# Patient Record
Sex: Female | Born: 1945 | Race: White | Hispanic: No | Marital: Married | Smoking: Never smoker
Health system: Southern US, Community
[De-identification: ages and names within clinical notes are randomized; demographics above are authoritative.]

## PROBLEM LIST (undated history)

## (undated) DIAGNOSIS — F0390 Unspecified dementia without behavioral disturbance: Secondary | ICD-10-CM

## (undated) DIAGNOSIS — M1990 Unspecified osteoarthritis, unspecified site: Secondary | ICD-10-CM

## (undated) DIAGNOSIS — G20 Parkinson's disease: Secondary | ICD-10-CM

## (undated) DIAGNOSIS — G20A1 Parkinson's disease without dyskinesia, without mention of fluctuations: Secondary | ICD-10-CM

## (undated) HISTORY — PX: APPENDECTOMY: SHX54

---

## 2006-03-04 ENCOUNTER — Ambulatory Visit: Payer: Self-pay | Admitting: Orthopedic Surgery

## 2009-10-13 ENCOUNTER — Ambulatory Visit: Payer: Self-pay | Admitting: Endocrinology

## 2012-05-12 ENCOUNTER — Ambulatory Visit: Payer: Self-pay | Admitting: Ophthalmology

## 2012-05-26 ENCOUNTER — Ambulatory Visit: Payer: Self-pay | Admitting: Ophthalmology

## 2012-06-10 DIAGNOSIS — G20 Parkinson's disease: Secondary | ICD-10-CM | POA: Insufficient documentation

## 2013-08-17 ENCOUNTER — Encounter: Payer: Self-pay | Admitting: Endocrinology

## 2013-08-18 ENCOUNTER — Encounter: Payer: Self-pay | Admitting: Endocrinology

## 2014-01-05 ENCOUNTER — Ambulatory Visit: Payer: Self-pay | Admitting: Orthopaedic Surgery

## 2014-06-10 NOTE — Op Note (Signed)
PATIENT NAME:  Jillian Vaughn, Jillian Vaughn MR#:  [id number] DATE OF BIRTH:  [birth year]  DATE OF PROCEDURE:  05/26/2012  PREOPERATIVE DIAGNOSIS: Visually significant cataract of the right eye.   POSTOPERATIVE DIAGNOSIS: Visually significant cataract of the right eye.   OPERATIVE PROCEDURE: Cataract extraction by phacoemulsification with implant of intraocular lens to the right eye.   SURGEON: Birder Robson, MD.   ANESTHESIA: Topical.  COMPLICATIONS: None.   TECHNIQUE:  Stop-and-chop.   DESCRIPTION OF PROCEDURE: The patient was examined and consented in the preoperative holding area where the aforementioned topical anesthesia was applied to the right eye, and then brought back to the operating room where the right eye was prepped and draped in the usual sterile ophthalmic fashion and a lid speculum was placed. A paracentesis was created with the side port blade and the anterior chamber was filled with viscoelastic. A near-clear corneal incision was performed with the steel keratome. A continuous curvilinear capsulorrhexis was performed with a cystotome followed by the capsulorrhexis forceps. Hydrodissection and hydrodelineation were carried out with BSS on a blunt cannula. The lens was removed in a stop-and-chop technique and the remaining cortical material was removed with the irrigation-aspiration handpiece. The capsular bag was inflated with viscoelastic and the Tecnis ZCB00 21.5-diopter lens, serial #[id number] was placed in the capsular bag without complication. The remaining viscoelastic was removed from the right eye with the irrigation-aspiration handpiece. The wounds were hydrated. The anterior chamber was flushed with Miostat and the eye was inflated to physiologic pressure; 0.1 mL of cefuroxime concentration, 10 mg/mL was placed in the anterior chamber. The wounds were found to be water tight. The right eye was dressed with Vigamox. The patient was given protective glasses to wear throughout the day  and a shield with which to sleep tonight. The patient was also given drops with which to begin a drop regimen today and will follow up with me in one day.       ____________________________ [name]. [name], MD wlp:dm D: 05/26/2012 12:37:57 ET T: 05/26/2012 13:21:38 ET JOB#: [id number]  cc: [name] L. [name], MD, <Dictator> [name] [name] MD ELECTRONICALLY SIGNED 06/02/2012 12:51

## 2014-10-02 ENCOUNTER — Encounter: Payer: Self-pay | Admitting: Emergency Medicine

## 2014-10-02 ENCOUNTER — Emergency Department
Admission: EM | Admit: 2014-10-02 | Discharge: 2014-10-02 | Disposition: A | Discharge status: Home / Self Care | Payer: Medicare HMO | Attending: Emergency Medicine | Admitting: Emergency Medicine

## 2014-10-02 DIAGNOSIS — K5901 Slow transit constipation: Secondary | ICD-10-CM | POA: Insufficient documentation

## 2014-10-02 DIAGNOSIS — K5900 Constipation, unspecified: Secondary | ICD-10-CM | POA: Diagnosis present

## 2014-10-02 DIAGNOSIS — G20 Parkinson's disease: Secondary | ICD-10-CM | POA: Diagnosis not present

## 2014-10-02 DIAGNOSIS — K5641 Fecal impaction: Secondary | ICD-10-CM

## 2014-10-02 HISTORY — DX: Unspecified osteoarthritis, unspecified site: M19.90

## 2014-10-02 HISTORY — DX: Parkinson's disease: G20

## 2014-10-02 HISTORY — DX: Parkinson's disease without dyskinesia, without mention of fluctuations: G20.A1

## 2014-10-02 MED ORDER — PEG 3350-KCL-NA BICARB-NACL 420 G PO SOLR: 4000.0000 mL | Freq: Once | ORAL | Status: DC

## 2014-10-02 NOTE — ED Notes (Addendum)
Pt reports she as only able to use the bathroom once a week states it has only been going on for months. Pt states she thinks her Parkinson's medication is the cause. States she only went a little this morning. Denies abd pain. Denies vomiting or fever. States in the past a oral laxative has helped. Pt states he stools are black .

## 2014-10-02 NOTE — Discharge Instructions (Signed)
Constipation °Constipation is when a person has fewer than three bowel movements a week, has difficulty having a bowel movement, or has stools that are dry, hard, or larger than normal. As people grow older, constipation is more common. If you try to fix constipation with medicines that make you have a bowel movement (laxatives), the problem may get worse. Long-term laxative use may cause the muscles of the colon to become weak. A low-fiber diet, not taking in enough fluids, and taking certain medicines may make constipation worse.  °CAUSES  °· Certain medicines, such as antidepressants, pain medicine, iron supplements, antacids, and water pills.   °· Certain diseases, such as diabetes, irritable bowel syndrome (IBS), thyroid disease, or depression.   °· Not drinking enough water.   °· Not eating enough fiber-rich foods.   °· Stress or travel.   °· Lack of physical activity or exercise.   °· Ignoring the urge to have a bowel movement.   °· Using laxatives too much.   °SIGNS AND SYMPTOMS  °· Having fewer than three bowel movements a week.   °· Straining to have a bowel movement.   °· Having stools that are hard, dry, or larger than normal.   °· Feeling full or bloated.   °· Pain in the lower abdomen.   °· Not feeling relief after having a bowel movement.   °DIAGNOSIS  °Your health care provider will take a medical history and perform a physical exam. Further testing may be done for severe constipation. Some tests may include: °· A barium enema X-ray to examine your rectum, colon, and, sometimes, your small intestine.   °· A sigmoidoscopy to examine your lower colon.   °· A colonoscopy to examine your entire colon. °TREATMENT  °Treatment will depend on the severity of your constipation and what is causing it. Some dietary treatments include drinking more fluids and eating more fiber-rich foods. Lifestyle treatments may include regular exercise. If these diet and lifestyle recommendations do not help, your health care  provider may recommend taking over-the-counter laxative medicines to help you have bowel movements. Prescription medicines may be prescribed if over-the-counter medicines do not work.  °HOME CARE INSTRUCTIONS  °· Eat foods that have a lot of fiber, such as fruits, vegetables, whole grains, and beans. °· Limit foods high in fat and processed sugars, such as french fries, hamburgers, cookies, candies, and soda.   °· A fiber supplement may be added to your diet if you cannot get enough fiber from foods.   °· Drink enough fluids to keep your urine clear or pale yellow.   °· Exercise regularly or as directed by your health care provider.   °· Go to the restroom when you have the urge to go. Do not hold it.   °· Only take over-the-counter or prescription medicines as directed by your health care provider. Do not take other medicines for constipation without talking to your health care provider first.   °SEEK IMMEDIATE MEDICAL CARE IF:  °· You have bright red blood in your stool.   °· Your constipation lasts for more than 4 days or gets worse.   °· You have abdominal or rectal pain.   °· You have thin, pencil-like stools.   °· You have unexplained weight loss. °MAKE SURE YOU:  °· Understand these instructions. °· Will watch your condition. °· Will get help right away if you are not doing well or get worse. °Document Released: 11/03/2003 Document Revised: 02/09/2013 Document Reviewed: 11/16/2012 °[employer]® Patient Information ©2015 [employer]. This information is not intended to replace advice given to you by your health care provider. Make sure you discuss any questions   you have with your health care provider.  Constipation Constipation is when a person:  Poops (has a bowel movement) less than 3 times a week.  Has a hard time pooping.  Has poop that is dry, hard, or bigger than normal. HOME CARE   Eat foods with a lot of fiber in them. This includes fruits, vegetables, beans, and whole grains such as brown  rice.  Avoid fatty foods and foods with a lot of sugar. This includes french fries, hamburgers, cookies, candy, and soda.  If you are not getting enough fiber from food, take products with added fiber in them (supplements).  Drink enough fluid to keep your pee (urine) clear or pale yellow.  Exercise on a regular basis, or as told by your doctor.  Go to the restroom when you feel like you need to poop. Do not hold it.  Only take medicine as told by your doctor. Do not take medicines that help you poop (laxatives) without talking to your doctor first. GET HELP RIGHT AWAY IF:   You have bright red blood in your poop (stool).  Your constipation lasts more than 4 days or gets worse.  You have belly (abdominal) or butt (rectal) pain.  You have thin poop (as thin as a pencil).  You lose weight, and it cannot be explained. MAKE SURE YOU:   Understand these instructions.  Will watch your condition.  Will get help right away if you are not doing well or get worse. Document Released: 07/24/2007 Document Revised: 02/09/2013 Document Reviewed: 11/16/2012 [facility] Patient Information 2015 [place], [place]. This information is not intended to replace advice given to you by your health care provider. Make sure you discuss any questions you have with your health care provider.  Fecal Impaction A fecal impaction happens when there is a large, firm amount of stool (or feces) that cannot be passed. The impacted stool is usually in the rectum, which is the lowest part of the large bowel. The impacted stool can block the colon and cause significant problems. CAUSES  The longer stool stays in the rectum, the harder it gets. Anything that slows down your bowel movements can lead to fecal impaction, such as:  Constipation. This can be a long-standing (chronic) problem or can happen suddenly (acute).  Painful conditions of the rectum, such as hemorrhoids or anal fissures. The pain of these conditions  can make you try to avoid having bowel movements.  Narcotic pain-relieving medicines, such as methadone, morphine, or codeine.  Not drinking enough fluids.  Inactivity and bed rest over long periods of time.  Diseases of the brain or nervous system that damage the nerves controlling the muscles of the intestines. SIGNS AND SYMPTOMS   Lack of normal bowel movements or changes in bowel patterns.  Sense of fullness in the rectum but unable to pass stool.  Pain or cramps in the abdominal area (often after meals).  Thin, watery discharge from the rectum. DIAGNOSIS  Your health care provider may suspect that you have a fecal impaction based on your symptoms and a physical exam. This will include an exam of your rectum. Sometimes X-rays or lab testing may be needed to confirm the diagnosis and to be sure there are no other problems.  TREATMENT   Initially an impaction can be removed manually. Using a gloved finger, your health care provider can remove hard stool from your rectum.  Medicine is sometimes needed. A suppository or enema can be given in the rectum to soften  the stool, which can stimulate a bowel movement. Medicines can also be given by mouth (orally).  Though rare, surgery may be needed if the colon has torn (perforated) due to blockage. HOME CARE INSTRUCTIONS   Develop regular bowel habits. This could include getting in the habit of having a bowel movement after your morning cup of coffee or after eating. Be sure to allow yourself enough time on the toilet.  Maintain a high-fiber diet.  Drink enough fluids to keep your urine clear or pale yellow as directed by your health care provider.  Exercise regularly.  If you begin to get constipated, increase the amount of fiber in your diet. Eat plenty of fruits, vegetables, whole wheat breads, bran, oatmeal, and similar products.  Take natural fiber laxatives or other laxatives only as directed by your health care provider. SEEK  MEDICAL CARE IF:   You have ongoing rectal pain.  You require enemas or suppositories more than twice a week.  You have rectal bleeding.  You have continued problems, or you develop abdominal pain.  You have thin, pencil-like stools. SEEK IMMEDIATE MEDICAL CARE IF:  You have black or tarry stools. MAKE SURE YOU:   Understand these instructions.  Will watch your condition.  Will get help right away if you are not doing well or get worse. Document Released: 10/28/2003 Document Revised: 11/25/2012 Document Reviewed: 08/11/2012 [facility] Patient Information 2015 [place], [place]. This information is not intended to replace advice given to you by your health care provider. Make sure you discuss any questions you have with your health care provider.

## 2014-10-02 NOTE — ED Provider Notes (Addendum)
[facility] Emergency Department Provider Note     Time seen: ----------------------------------------- 10:43 AM on 10/02/2014 -----------------------------------------    I have reviewed the triage vital signs and the nursing notes.   HISTORY  Chief Complaint Constipation    HPI Jillian Vaughn is a 69 y.o. female who presents to ER for constipation. Reports she is only able to use the bathroom once a week and states his been going on for months. Patient thinks it's her Parkinson's medication. Patient underwent a little less morning, denies any abdominal pain. States in the past oral laxative has helped.   Past Medical History  Diagnosis Date  . Parkinson disease   . Arthritis     There are no active problems to display for this patient.   Past Surgical History  Procedure Laterality Date  . Appendectomy      Allergies Review of patient's allergies indicates not on file.  Social History Social History  Substance Use Topics  . Smoking status: Never Smoker   . Smokeless tobacco: Never Used  . Alcohol Use: Yes     Comment: occ    Review of Systems Constitutional: Negative for fever. Eyes: Negative for visual changes. ENT: Negative for sore throat. Cardiovascular: Negative for chest pain. Respiratory: Negative for shortness of breath. Gastrointestinal: Negative for abdominal pain, positive constipation Genitourinary: Negative for dysuria. Musculoskeletal: Negative for back pain. Skin: Negative for rash. Neurological: Negative for headaches, focal weakness or numbness.  10-point ROS otherwise negative.  ____________________________________________   PHYSICAL EXAM:  VITAL SIGNS: ED Triage Vitals  Enc Vitals Group     BP 10/02/14 0949 159/73 mmHg     Pulse Rate 10/02/14 0949 89     Resp --      Temp 10/02/14 0949 97.8 F (36.6 C)     Temp Source 10/02/14 0949 Oral     SpO2 10/02/14 0949 100 %     Weight 10/02/14 0949 94 lb  (42.638 kg)     Height 10/02/14 0949 5' 2.5" (1.588 m)     Head Cir --      Peak Flow --      Pain Score --      Pain Loc --      Pain Edu? --      Excl. in [place]? --     Constitutional: Alert and oriented. Well appearing and in no distress. Eyes: Conjunctivae are normal. PERRL. Normal extraocular movements. ENT   Head: Normocephalic and atraumatic.   Nose: No congestion/rhinnorhea.   Mouth/Throat: Mucous membranes are moist.   Neck: No stridor. Cardiovascular: Normal rate, regular rhythm. Normal and symmetric distal pulses are present in all extremities. No murmurs, rubs, or gallops. Respiratory: Normal respiratory effort without tachypnea nor retractions. Breath sounds are clear and equal bilaterally. No wheezes/rales/rhonchi. Gastrointestinal: Soft and nontender. No distention. No abdominal bruits.  Rectal: There is stool in the rectal vault, mild to moderate fecal impaction Musculoskeletal: Nontender with normal range of motion in all extremities. No joint effusions.  No lower extremity tenderness nor edema. Neurologic:  Normal speech and language. No gross focal neurologic deficits are appreciated. Speech is normal. No gait instability. Skin:  Skin is warm, dry and intact. No rash noted. Psychiatric: Mood and affect are normal. Speech and behavior are normal. Patient exhibits appropriate insight and judgment.  ____________________________________________  ED COURSE:  Pertinent labs & imaging results that were available during my care of the patient were reviewed by me and considered in my medical decision  making (see chart for details). Patient will receive a soapsuds enema, Will reevaluate  ____________________________________________  FINAL ASSESSMENT AND PLAN  Constipation, fecal impaction  Plan: Patient with labs and imaging as dictated above. Patient constipated, improved with soapsuds enema and disimpaction. We'll discharge with GoLYTELY to continue helping  her completely pass all of her fecal contents at home.   Earleen Newport, MD   Earleen Newport, MD 10/02/14 Petros, MD 10/02/14 1257

## 2014-11-02 ENCOUNTER — Other Ambulatory Visit: Payer: Self-pay | Admitting: Endocrinology

## 2014-11-02 DIAGNOSIS — Z1231 Encounter for screening mammogram for malignant neoplasm of breast: Secondary | ICD-10-CM

## 2014-11-17 ENCOUNTER — Ambulatory Visit
Admission: RE | Admit: 2014-11-17 | Discharge: 2014-11-17 | Disposition: A | Discharge status: Home / Self Care | Payer: Medicare HMO | Source: Ambulatory Visit | Attending: Endocrinology | Admitting: Endocrinology

## 2014-11-17 ENCOUNTER — Other Ambulatory Visit: Payer: Self-pay | Admitting: Endocrinology

## 2014-11-17 DIAGNOSIS — Z1231 Encounter for screening mammogram for malignant neoplasm of breast: Secondary | ICD-10-CM

## 2014-11-18 ENCOUNTER — Other Ambulatory Visit: Payer: Self-pay | Admitting: Endocrinology

## 2014-11-18 DIAGNOSIS — M25551 Pain in right hip: Secondary | ICD-10-CM

## 2014-11-18 DIAGNOSIS — R1031 Right lower quadrant pain: Secondary | ICD-10-CM

## 2014-11-23 ENCOUNTER — Ambulatory Visit
Admission: RE | Admit: 2014-11-23 | Discharge: 2014-11-23 | Disposition: A | Discharge status: Home / Self Care | Payer: Medicare HMO | Source: Ambulatory Visit | Attending: Endocrinology | Admitting: Endocrinology

## 2014-11-23 DIAGNOSIS — D259 Leiomyoma of uterus, unspecified: Secondary | ICD-10-CM | POA: Diagnosis not present

## 2014-11-23 DIAGNOSIS — R1031 Right lower quadrant pain: Secondary | ICD-10-CM

## 2014-11-23 DIAGNOSIS — N83201 Unspecified ovarian cyst, right side: Secondary | ICD-10-CM | POA: Insufficient documentation

## 2014-11-23 DIAGNOSIS — M25551 Pain in right hip: Secondary | ICD-10-CM | POA: Insufficient documentation

## 2014-11-23 MED ORDER — IOHEXOL 300 MG/ML  SOLN
75.0000 mL | Freq: Once | INTRAMUSCULAR | Status: AC | PRN
  Administered 2014-11-23: 75 mL via INTRAVENOUS

## 2014-12-28 ENCOUNTER — Other Ambulatory Visit: Payer: Self-pay | Admitting: Orthopedic Surgery

## 2014-12-28 DIAGNOSIS — M5440 Lumbago with sciatica, unspecified side: Principal | ICD-10-CM

## 2014-12-28 DIAGNOSIS — M545 Low back pain, unspecified: Secondary | ICD-10-CM

## 2015-01-16 ENCOUNTER — Ambulatory Visit: Payer: Medicare HMO

## 2015-02-03 ENCOUNTER — Ambulatory Visit: Payer: Medicare HMO

## 2015-02-28 DIAGNOSIS — M5416 Radiculopathy, lumbar region: Secondary | ICD-10-CM | POA: Diagnosis not present

## 2015-02-28 DIAGNOSIS — M5126 Other intervertebral disc displacement, lumbar region: Secondary | ICD-10-CM | POA: Diagnosis not present

## 2015-03-02 DIAGNOSIS — G20 Parkinson's disease: Secondary | ICD-10-CM | POA: Diagnosis not present

## 2015-03-02 DIAGNOSIS — M25552 Pain in left hip: Secondary | ICD-10-CM | POA: Diagnosis not present

## 2015-03-02 DIAGNOSIS — E042 Nontoxic multinodular goiter: Secondary | ICD-10-CM | POA: Diagnosis not present

## 2015-03-02 DIAGNOSIS — N951 Menopausal and female climacteric states: Secondary | ICD-10-CM | POA: Diagnosis not present

## 2015-03-02 DIAGNOSIS — E785 Hyperlipidemia, unspecified: Secondary | ICD-10-CM | POA: Diagnosis not present

## 2015-03-02 DIAGNOSIS — M810 Age-related osteoporosis without current pathological fracture: Secondary | ICD-10-CM | POA: Diagnosis not present

## 2015-03-02 DIAGNOSIS — F5101 Primary insomnia: Secondary | ICD-10-CM | POA: Diagnosis not present

## 2015-03-02 DIAGNOSIS — N6019 Diffuse cystic mastopathy of unspecified breast: Secondary | ICD-10-CM | POA: Diagnosis not present

## 2015-03-02 DIAGNOSIS — E559 Vitamin D deficiency, unspecified: Secondary | ICD-10-CM | POA: Diagnosis not present

## 2015-03-02 DIAGNOSIS — K5900 Constipation, unspecified: Secondary | ICD-10-CM | POA: Diagnosis not present

## 2015-03-02 DIAGNOSIS — D259 Leiomyoma of uterus, unspecified: Secondary | ICD-10-CM | POA: Diagnosis not present

## 2015-03-09 DIAGNOSIS — G20 Parkinson's disease: Secondary | ICD-10-CM | POA: Diagnosis not present

## 2015-03-09 DIAGNOSIS — E042 Nontoxic multinodular goiter: Secondary | ICD-10-CM | POA: Diagnosis not present

## 2015-03-09 DIAGNOSIS — D259 Leiomyoma of uterus, unspecified: Secondary | ICD-10-CM | POA: Diagnosis not present

## 2015-03-09 DIAGNOSIS — F5101 Primary insomnia: Secondary | ICD-10-CM | POA: Diagnosis not present

## 2015-03-09 DIAGNOSIS — E785 Hyperlipidemia, unspecified: Secondary | ICD-10-CM | POA: Diagnosis not present

## 2015-03-09 DIAGNOSIS — M810 Age-related osteoporosis without current pathological fracture: Secondary | ICD-10-CM | POA: Diagnosis not present

## 2015-03-09 DIAGNOSIS — N951 Menopausal and female climacteric states: Secondary | ICD-10-CM | POA: Diagnosis not present

## 2015-03-09 DIAGNOSIS — E559 Vitamin D deficiency, unspecified: Secondary | ICD-10-CM | POA: Diagnosis not present

## 2015-03-09 DIAGNOSIS — M25552 Pain in left hip: Secondary | ICD-10-CM | POA: Diagnosis not present

## 2015-03-09 DIAGNOSIS — K5900 Constipation, unspecified: Secondary | ICD-10-CM | POA: Diagnosis not present

## 2015-03-09 DIAGNOSIS — N6019 Diffuse cystic mastopathy of unspecified breast: Secondary | ICD-10-CM | POA: Diagnosis not present

## 2015-03-21 DIAGNOSIS — M5416 Radiculopathy, lumbar region: Secondary | ICD-10-CM | POA: Diagnosis not present

## 2015-03-21 DIAGNOSIS — M5126 Other intervertebral disc displacement, lumbar region: Secondary | ICD-10-CM | POA: Diagnosis not present

## 2015-03-23 DIAGNOSIS — M25552 Pain in left hip: Secondary | ICD-10-CM | POA: Diagnosis not present

## 2015-03-23 DIAGNOSIS — E042 Nontoxic multinodular goiter: Secondary | ICD-10-CM | POA: Diagnosis not present

## 2015-03-23 DIAGNOSIS — N6019 Diffuse cystic mastopathy of unspecified breast: Secondary | ICD-10-CM | POA: Diagnosis not present

## 2015-03-23 DIAGNOSIS — M810 Age-related osteoporosis without current pathological fracture: Secondary | ICD-10-CM | POA: Diagnosis not present

## 2015-03-23 DIAGNOSIS — E785 Hyperlipidemia, unspecified: Secondary | ICD-10-CM | POA: Diagnosis not present

## 2015-03-23 DIAGNOSIS — N951 Menopausal and female climacteric states: Secondary | ICD-10-CM | POA: Diagnosis not present

## 2015-03-23 DIAGNOSIS — E559 Vitamin D deficiency, unspecified: Secondary | ICD-10-CM | POA: Diagnosis not present

## 2015-03-23 DIAGNOSIS — F5101 Primary insomnia: Secondary | ICD-10-CM | POA: Diagnosis not present

## 2015-03-23 DIAGNOSIS — D259 Leiomyoma of uterus, unspecified: Secondary | ICD-10-CM | POA: Diagnosis not present

## 2015-03-23 DIAGNOSIS — G20 Parkinson's disease: Secondary | ICD-10-CM | POA: Diagnosis not present

## 2015-03-23 DIAGNOSIS — K5900 Constipation, unspecified: Secondary | ICD-10-CM | POA: Diagnosis not present

## 2015-04-03 DIAGNOSIS — M4806 Spinal stenosis, lumbar region: Secondary | ICD-10-CM | POA: Diagnosis not present

## 2015-04-03 DIAGNOSIS — M5416 Radiculopathy, lumbar region: Secondary | ICD-10-CM | POA: Diagnosis not present

## 2015-04-25 DIAGNOSIS — F5101 Primary insomnia: Secondary | ICD-10-CM | POA: Diagnosis not present

## 2015-04-25 DIAGNOSIS — N951 Menopausal and female climacteric states: Secondary | ICD-10-CM | POA: Diagnosis not present

## 2015-04-25 DIAGNOSIS — E559 Vitamin D deficiency, unspecified: Secondary | ICD-10-CM | POA: Diagnosis not present

## 2015-04-25 DIAGNOSIS — E042 Nontoxic multinodular goiter: Secondary | ICD-10-CM | POA: Diagnosis not present

## 2015-04-25 DIAGNOSIS — N6019 Diffuse cystic mastopathy of unspecified breast: Secondary | ICD-10-CM | POA: Diagnosis not present

## 2015-04-25 DIAGNOSIS — D259 Leiomyoma of uterus, unspecified: Secondary | ICD-10-CM | POA: Diagnosis not present

## 2015-04-25 DIAGNOSIS — K5900 Constipation, unspecified: Secondary | ICD-10-CM | POA: Diagnosis not present

## 2015-04-25 DIAGNOSIS — R634 Abnormal weight loss: Secondary | ICD-10-CM | POA: Diagnosis not present

## 2015-04-25 DIAGNOSIS — E785 Hyperlipidemia, unspecified: Secondary | ICD-10-CM | POA: Diagnosis not present

## 2015-04-25 DIAGNOSIS — G20 Parkinson's disease: Secondary | ICD-10-CM | POA: Diagnosis not present

## 2015-04-25 DIAGNOSIS — M810 Age-related osteoporosis without current pathological fracture: Secondary | ICD-10-CM | POA: Diagnosis not present

## 2015-04-25 DIAGNOSIS — M25552 Pain in left hip: Secondary | ICD-10-CM | POA: Diagnosis not present

## 2015-04-27 ENCOUNTER — Emergency Department (HOSPITAL_COMMUNITY)
Admission: EM | Admit: 2015-04-27 | Discharge: 2015-04-28 | Disposition: A | Discharge status: Home / Self Care | Payer: Medicare HMO | Attending: Emergency Medicine | Admitting: Emergency Medicine

## 2015-04-27 ENCOUNTER — Emergency Department (HOSPITAL_COMMUNITY): Payer: Medicare HMO

## 2015-04-27 ENCOUNTER — Encounter (HOSPITAL_COMMUNITY): Payer: Self-pay | Admitting: *Deleted

## 2015-04-27 DIAGNOSIS — D485 Neoplasm of uncertain behavior of skin: Secondary | ICD-10-CM | POA: Diagnosis not present

## 2015-04-27 DIAGNOSIS — R1310 Dysphagia, unspecified: Secondary | ICD-10-CM

## 2015-04-27 DIAGNOSIS — Z7982 Long term (current) use of aspirin: Secondary | ICD-10-CM | POA: Insufficient documentation

## 2015-04-27 DIAGNOSIS — R109 Unspecified abdominal pain: Secondary | ICD-10-CM | POA: Diagnosis not present

## 2015-04-27 DIAGNOSIS — D0421 Carcinoma in situ of skin of right ear and external auricular canal: Secondary | ICD-10-CM | POA: Diagnosis not present

## 2015-04-27 DIAGNOSIS — M25552 Pain in left hip: Secondary | ICD-10-CM | POA: Diagnosis not present

## 2015-04-27 DIAGNOSIS — M545 Low back pain: Secondary | ICD-10-CM | POA: Diagnosis not present

## 2015-04-27 DIAGNOSIS — M4306 Spondylolysis, lumbar region: Secondary | ICD-10-CM | POA: Diagnosis not present

## 2015-04-27 DIAGNOSIS — Z79899 Other long term (current) drug therapy: Secondary | ICD-10-CM | POA: Diagnosis not present

## 2015-04-27 DIAGNOSIS — M1990 Unspecified osteoarthritis, unspecified site: Secondary | ICD-10-CM | POA: Insufficient documentation

## 2015-04-27 DIAGNOSIS — G20 Parkinson's disease: Secondary | ICD-10-CM | POA: Insufficient documentation

## 2015-04-27 DIAGNOSIS — L570 Actinic keratosis: Secondary | ICD-10-CM | POA: Diagnosis not present

## 2015-04-27 DIAGNOSIS — X32XXXA Exposure to sunlight, initial encounter: Secondary | ICD-10-CM | POA: Diagnosis not present

## 2015-04-27 DIAGNOSIS — K3189 Other diseases of stomach and duodenum: Secondary | ICD-10-CM | POA: Diagnosis not present

## 2015-04-27 DIAGNOSIS — M4806 Spinal stenosis, lumbar region: Secondary | ICD-10-CM | POA: Diagnosis not present

## 2015-04-27 LAB — CBC WITH DIFFERENTIAL/PLATELET
Basophils Absolute: 0 10*3/uL (ref 0.0–0.1)
Basophils Relative: 0 %
EOS ABS: 0.1 10*3/uL (ref 0.0–0.7)
Eosinophils Relative: 2 %
HEMATOCRIT: 31.9 % — AB (ref 36.0–46.0)
HEMOGLOBIN: 10.7 g/dL — AB (ref 12.0–15.0)
LYMPHS ABS: 1.3 10*3/uL (ref 0.7–4.0)
LYMPHS PCT: 19 %
MCH: 32.1 pg (ref 26.0–34.0)
MCHC: 33.5 g/dL (ref 30.0–36.0)
MCV: 95.8 fL (ref 78.0–100.0)
MONOS PCT: 8 %
Monocytes Absolute: 0.6 10*3/uL (ref 0.1–1.0)
NEUTROS PCT: 71 %
Neutro Abs: 4.9 10*3/uL (ref 1.7–7.7)
Platelets: 279 10*3/uL (ref 150–400)
RBC: 3.33 MIL/uL — AB (ref 3.87–5.11)
RDW: 12.8 % (ref 11.5–15.5)
WBC: 6.8 10*3/uL (ref 4.0–10.5)

## 2015-04-27 LAB — BASIC METABOLIC PANEL
Anion gap: 10 (ref 5–15)
BUN: 19 mg/dL (ref 6–20)
CHLORIDE: 102 mmol/L (ref 101–111)
CO2: 27 mmol/L (ref 22–32)
CREATININE: 1.02 mg/dL — AB (ref 0.44–1.00)
Calcium: 9.9 mg/dL (ref 8.9–10.3)
GFR calc non Af Amer: 54 mL/min — ABNORMAL LOW (ref >60)
Glucose, Bld: 106 mg/dL — ABNORMAL HIGH (ref 65–99)
POTASSIUM: 4.4 mmol/L (ref 3.5–5.1)
Sodium: 139 mmol/L (ref 135–145)

## 2015-04-27 NOTE — ED Notes (Signed)
Pt in from PCP for eval d/t xray completed at their office, pt reports seeing PCP for L hip pain, pt denies SOB, n/v/d, & CP, pt ambulatory, pt A&O x4

## 2015-04-27 NOTE — ED Provider Notes (Signed)
CSN: [id number]     Arrival date & time 04/27/15  1622 History   First MD Initiated Contact with Patient 04/27/15 2046     Chief Complaint  Patient presents with  . Hip Pain  . Back Pain     (Consider location/radiation/quality/duration/timing/severity/associated sxs/prior Treatment) HPI Comments: Patient with history of chronic back pain, left hip pain, Parkinson's disease -- presented to her orthopedic office today for continued left hip pain. Patient describes pain in the hip and towards the superior part of the pelvis. Pain is worse in the morning and improves as the day goes on. She had x-rays done while in the office that demonstrated distention in her stomach. There was concern for gastric outlet obstruction and patient was instructed to go to the emergency department for further evaluation. Patient denies any abdominal pain, vomiting. She does have chronic constipation as needed enema in the past. She takes Tylenol for pain only. No history of abdominal surgeries other than remote appendectomy. She does not currently have any urinary symptoms.   Patient is a 70 y.o. female presenting with hip pain and back pain. The history is provided by the patient and medical records.  Hip Pain Associated symptoms include arthralgias. Pertinent negatives include no abdominal pain, chest pain, coughing, fever, headaches, myalgias, nausea, rash, sore throat or vomiting.  Back Pain Associated symptoms: no abdominal pain, no chest pain, no dysuria, no fever and no headaches     Past Medical History  Diagnosis Date  . Parkinson disease ([place])   . Arthritis    Past Surgical History  Procedure Laterality Date  . Appendectomy     No family history on file. Social History  Substance Use Topics  . Smoking status: Never Smoker   . Smokeless tobacco: Never Used  . Alcohol Use: Yes     Comment: occ   OB History    No data available     Review of Systems  Constitutional: Negative for fever.    HENT: Negative for rhinorrhea and sore throat.   Eyes: Negative for redness.  Respiratory: Negative for cough.   Cardiovascular: Negative for chest pain.  Gastrointestinal: Negative for nausea, vomiting, abdominal pain and diarrhea.  Genitourinary: Negative for dysuria.  Musculoskeletal: Positive for back pain and arthralgias. Negative for myalgias.  Skin: Negative for rash.  Neurological: Negative for headaches.      Allergies  Review of patient's allergies indicates no known allergies.  Home Medications   Prior to Admission medications   Medication Sig Start Date End Date Taking? Authorizing Provider  acetaminophen (TYLENOL) 650 MG CR tablet Take 1,300 mg by mouth 2 (two) times daily as needed for pain.   Yes Historical Provider, MD  amantadine (SYMMETREL) 100 MG capsule Take 100 mg by mouth 2 (two) times daily. 03/23/15  Yes Historical Provider, MD  aspirin 325 MG tablet Take 325 mg by mouth daily.   Yes Historical Provider, MD  carbidopa-levodopa (SINEMET) 25-100 MG tablet Take 1-2 tablets by mouth 4 (four) times daily. 03/02/15  Yes Historical Provider, MD  lubiprostone (AMITIZA) 24 MCG capsule Take 24 mcg by mouth daily with breakfast.   Yes Historical Provider, MD  meloxicam (MOBIC) 7.5 MG tablet Take 7.5 mg by mouth daily.   Yes Historical Provider, MD  pravastatin (PRAVACHOL) 40 MG tablet Take 40 mg by mouth at bedtime.   Yes Historical Provider, MD  Riboflavin (VITAMIN B-2) 50 MG TABS Take 50 mg by mouth daily.   Yes Historical Provider, MD  rOPINIRole (  REQUIP) 0.5 MG tablet Take 0.5 mg by mouth daily. 03/02/15  Yes Historical Provider, MD  Vitamin D, Ergocalciferol, (DRISDOL) 50000 units CAPS capsule Take 50,000 Units by mouth every Wednesday. 04/18/15  Yes Historical Provider, MD  polyethylene glycol-electrolytes (TRILYTE) 420 G solution Take 4,000 mLs by mouth once. Patient not taking: Reported on 04/27/2015 10/02/14   Earleen Newport, MD   BP 153/90 mmHg  Pulse 83   Temp(Src) 97.6 F (36.4 C) (Oral)  Resp 14  SpO2 100%   Physical Exam  Constitutional: She appears well-developed and well-nourished.  HENT:  Head: Normocephalic and atraumatic.  Eyes: Conjunctivae are normal. Right eye exhibits no discharge. Left eye exhibits no discharge.  Neck: Normal range of motion. Neck supple.  Cardiovascular: Normal rate, regular rhythm and normal heart sounds.   Pulmonary/Chest: Effort normal and breath sounds normal. No respiratory distress. She has no wheezes. She has no rales.  Abdominal: Soft. There is no tenderness. There is no rebound and no guarding.  Musculoskeletal: Normal range of motion. She exhibits no edema.       Right hip: Normal.       Left hip: She exhibits tenderness. She exhibits normal range of motion, normal strength and no bony tenderness.       Left knee: Normal.       Left ankle: Normal.       Cervical back: Normal.       Thoracic back: Normal.       Lumbar back: She exhibits tenderness and bony tenderness. She exhibits normal range of motion.       Back:       Left upper leg: Normal.       Left lower leg: Normal.       Left foot: Normal.  Neurological: She is alert.  Skin: Skin is warm and dry.  Psychiatric: She has a normal mood and affect.  Nursing note and vitals reviewed.   ED Course  Procedures (including critical care time) Labs Review Labs Reviewed  BASIC METABOLIC PANEL - Abnormal; Notable for the following:    Glucose, Bld 106 (*)    Creatinine, Ser 1.02 (*)    GFR calc non Af Amer 54 (*)    All other components within normal limits  CBC WITH DIFFERENTIAL/PLATELET - Abnormal; Notable for the following:    RBC 3.33 (*)    Hemoglobin 10.7 (*)    HCT 31.9 (*)    All other components within normal limits    Imaging Review Dg Abd Acute W/chest  04/27/2015  CLINICAL DATA:  Abnormal imaging. EXAM: DG ABDOMEN ACUTE W/ 1V CHEST COMPARISON:  None. FINDINGS: Single view of the chest: Heart size is normal. Lungs appear  hyperexpanded suggesting COPD. Lungs are clear. Osseous structures about the chest are unremarkable. Supine and upright views of the abdomen: Stomach appears markedly distended with air, with associated air-fluid levels. No large or small bowel dilatation appreciated. Fairly large amount of stool is seen throughout the normal-caliber colon. No evidence of soft tissue mass or abnormal fluid collection identified. No evidence of free intraperitoneal air. Mild degenerative changes seen within the scoliotic lumbar spine. IMPRESSION: 1. Stomach appears markedly distended with air, with associated air-fluid levels. This suggests gastric outlet obstruction. Would consider CT abdomen with oral and IV contrast to exclude obstructing mass. 2. Hyperexpanded lungs suggesting COPD.  Lungs are clear. Electronically Signed   By: Franki Cabot M.D.   On: 04/27/2015 17:16   I have personally reviewed and  evaluated these images and lab results as part of my medical decision-making.   EKG Interpretation   Date/Time:  Thursday April 27 2015 16:31:15 EST Ventricular Rate:  90 PR Interval:  118 QRS Duration: 62 QT Interval:  330 QTC Calculation: 403 R Axis:   59 Text Interpretation:  Normal sinus rhythm Possible Left atrial enlargement  Septal infarct , age undetermined Abnormal ECG No previous ECGs available  Confirmed by Christy Gentles  MD, DONALD ([id number]) on 04/27/2015 9:25:31 PM       Patient seen and examined. Discussed with and seen by Dr. Christy Gentles. Will get CT to eval abnormal x-ray findings.   Vital signs reviewed and are as follows: BP 153/90 mmHg  Pulse 83  Temp(Src) 97.6 F (36.4 C) (Oral)  Resp 14  SpO2 100%  1:14 AM Patient in CT. Handoff to Dr. Betsey Holiday at shift change. Dispo based on results.   MDM   Final diagnoses:  Hip pain, left   Pending completion of work-up.     Carlisle Cater, PA-C 04/28/15 0118  Orpah Greek, MD 04/28/15 [phone]

## 2015-04-27 NOTE — ED Notes (Signed)
Copy of xray sent from PCP shown to Christy Gentles, MD, verbal order given for acute abd xray

## 2015-04-27 NOTE — ED Notes (Signed)
Xray results suggest increase in acuity for CT

## 2015-04-28 ENCOUNTER — Emergency Department (HOSPITAL_COMMUNITY): Payer: Medicare HMO

## 2015-04-28 ENCOUNTER — Encounter (HOSPITAL_COMMUNITY): Payer: Self-pay | Admitting: Radiology

## 2015-04-28 DIAGNOSIS — R109 Unspecified abdominal pain: Secondary | ICD-10-CM | POA: Diagnosis not present

## 2015-04-28 MED ORDER — IOHEXOL 300 MG/ML  SOLN
80.0000 mL | Freq: Once | INTRAMUSCULAR | Status: AC | PRN
  Administered 2015-04-28: 100 mL via INTRAVENOUS

## 2015-04-28 NOTE — ED Notes (Signed)
Patient transported to CT 

## 2015-04-28 NOTE — Discharge Instructions (Signed)

## 2015-05-08 DIAGNOSIS — M4306 Spondylolysis, lumbar region: Secondary | ICD-10-CM | POA: Diagnosis not present

## 2015-05-08 DIAGNOSIS — R634 Abnormal weight loss: Secondary | ICD-10-CM | POA: Diagnosis not present

## 2015-05-08 DIAGNOSIS — M4806 Spinal stenosis, lumbar region: Secondary | ICD-10-CM | POA: Diagnosis not present

## 2015-05-08 DIAGNOSIS — R1310 Dysphagia, unspecified: Secondary | ICD-10-CM | POA: Diagnosis not present

## 2015-05-08 DIAGNOSIS — R110 Nausea: Secondary | ICD-10-CM | POA: Diagnosis not present

## 2015-05-08 DIAGNOSIS — R6881 Early satiety: Secondary | ICD-10-CM | POA: Diagnosis not present

## 2015-05-09 ENCOUNTER — Other Ambulatory Visit: Payer: Self-pay | Admitting: Gastroenterology

## 2015-05-09 DIAGNOSIS — R110 Nausea: Secondary | ICD-10-CM

## 2015-05-09 DIAGNOSIS — R6881 Early satiety: Secondary | ICD-10-CM

## 2015-05-09 DIAGNOSIS — R1310 Dysphagia, unspecified: Secondary | ICD-10-CM

## 2015-05-12 ENCOUNTER — Ambulatory Visit
Admission: RE | Admit: 2015-05-12 | Discharge: 2015-05-12 | Disposition: A | Discharge status: Home / Self Care | Payer: PPO | Source: Ambulatory Visit | Attending: Gastroenterology | Admitting: Gastroenterology

## 2015-05-12 DIAGNOSIS — R6881 Early satiety: Secondary | ICD-10-CM | POA: Diagnosis not present

## 2015-05-12 DIAGNOSIS — R110 Nausea: Secondary | ICD-10-CM | POA: Insufficient documentation

## 2015-05-12 DIAGNOSIS — R1310 Dysphagia, unspecified: Secondary | ICD-10-CM | POA: Insufficient documentation

## 2015-05-24 DIAGNOSIS — M5416 Radiculopathy, lumbar region: Secondary | ICD-10-CM | POA: Diagnosis not present

## 2015-05-24 DIAGNOSIS — M4806 Spinal stenosis, lumbar region: Secondary | ICD-10-CM | POA: Diagnosis not present

## 2015-05-24 DIAGNOSIS — M5116 Intervertebral disc disorders with radiculopathy, lumbar region: Secondary | ICD-10-CM | POA: Diagnosis not present

## 2015-05-29 DIAGNOSIS — L908 Other atrophic disorders of skin: Secondary | ICD-10-CM | POA: Diagnosis not present

## 2015-05-29 DIAGNOSIS — D0421 Carcinoma in situ of skin of right ear and external auricular canal: Secondary | ICD-10-CM | POA: Diagnosis not present

## 2015-05-29 DIAGNOSIS — L578 Other skin changes due to chronic exposure to nonionizing radiation: Secondary | ICD-10-CM | POA: Diagnosis not present

## 2015-05-29 DIAGNOSIS — L814 Other melanin hyperpigmentation: Secondary | ICD-10-CM | POA: Diagnosis not present

## 2015-06-01 DIAGNOSIS — E042 Nontoxic multinodular goiter: Secondary | ICD-10-CM | POA: Diagnosis not present

## 2015-06-08 DIAGNOSIS — E785 Hyperlipidemia, unspecified: Secondary | ICD-10-CM | POA: Diagnosis not present

## 2015-06-08 DIAGNOSIS — M25552 Pain in left hip: Secondary | ICD-10-CM | POA: Diagnosis not present

## 2015-06-08 DIAGNOSIS — K5900 Constipation, unspecified: Secondary | ICD-10-CM | POA: Diagnosis not present

## 2015-06-08 DIAGNOSIS — E042 Nontoxic multinodular goiter: Secondary | ICD-10-CM | POA: Diagnosis not present

## 2015-06-08 DIAGNOSIS — E559 Vitamin D deficiency, unspecified: Secondary | ICD-10-CM | POA: Diagnosis not present

## 2015-06-08 DIAGNOSIS — N951 Menopausal and female climacteric states: Secondary | ICD-10-CM | POA: Diagnosis not present

## 2015-06-08 DIAGNOSIS — M5116 Intervertebral disc disorders with radiculopathy, lumbar region: Secondary | ICD-10-CM | POA: Diagnosis not present

## 2015-06-08 DIAGNOSIS — M810 Age-related osteoporosis without current pathological fracture: Secondary | ICD-10-CM | POA: Diagnosis not present

## 2015-06-08 DIAGNOSIS — M4806 Spinal stenosis, lumbar region: Secondary | ICD-10-CM | POA: Diagnosis not present

## 2015-06-08 DIAGNOSIS — N6019 Diffuse cystic mastopathy of unspecified breast: Secondary | ICD-10-CM | POA: Diagnosis not present

## 2015-06-08 DIAGNOSIS — M5416 Radiculopathy, lumbar region: Secondary | ICD-10-CM | POA: Diagnosis not present

## 2015-06-08 DIAGNOSIS — D259 Leiomyoma of uterus, unspecified: Secondary | ICD-10-CM | POA: Diagnosis not present

## 2015-06-08 DIAGNOSIS — R071 Chest pain on breathing: Secondary | ICD-10-CM | POA: Diagnosis not present

## 2015-06-08 DIAGNOSIS — G20 Parkinson's disease: Secondary | ICD-10-CM | POA: Diagnosis not present

## 2015-06-19 ENCOUNTER — Ambulatory Visit
Admission: RE | Admit: 2015-06-19 | Discharge: 2015-06-19 | Disposition: A | Discharge status: Home / Self Care | Payer: PPO | Source: Ambulatory Visit | Attending: Gastroenterology | Admitting: Gastroenterology

## 2015-06-19 ENCOUNTER — Encounter: Payer: Self-pay | Admitting: *Deleted

## 2015-06-19 ENCOUNTER — Ambulatory Visit: Payer: PPO | Admitting: Certified Registered Nurse Anesthetist

## 2015-06-19 ENCOUNTER — Encounter
Admission: RE | Disposition: A | Discharge status: Home / Self Care | Payer: Self-pay | Source: Ambulatory Visit | Attending: Gastroenterology

## 2015-06-19 DIAGNOSIS — K2950 Unspecified chronic gastritis without bleeding: Secondary | ICD-10-CM | POA: Diagnosis not present

## 2015-06-19 DIAGNOSIS — R110 Nausea: Secondary | ICD-10-CM | POA: Diagnosis not present

## 2015-06-19 DIAGNOSIS — R634 Abnormal weight loss: Secondary | ICD-10-CM | POA: Insufficient documentation

## 2015-06-19 DIAGNOSIS — K3189 Other diseases of stomach and duodenum: Secondary | ICD-10-CM | POA: Diagnosis not present

## 2015-06-19 DIAGNOSIS — K2960 Other gastritis without bleeding: Secondary | ICD-10-CM | POA: Diagnosis not present

## 2015-06-19 DIAGNOSIS — K2210 Ulcer of esophagus without bleeding: Secondary | ICD-10-CM | POA: Diagnosis not present

## 2015-06-19 DIAGNOSIS — K224 Dyskinesia of esophagus: Secondary | ICD-10-CM | POA: Diagnosis not present

## 2015-06-19 DIAGNOSIS — R1310 Dysphagia, unspecified: Secondary | ICD-10-CM | POA: Diagnosis not present

## 2015-06-19 DIAGNOSIS — M1990 Unspecified osteoarthritis, unspecified site: Secondary | ICD-10-CM | POA: Diagnosis not present

## 2015-06-19 DIAGNOSIS — K210 Gastro-esophageal reflux disease with esophagitis: Secondary | ICD-10-CM | POA: Insufficient documentation

## 2015-06-19 DIAGNOSIS — K2970 Gastritis, unspecified, without bleeding: Secondary | ICD-10-CM | POA: Diagnosis not present

## 2015-06-19 DIAGNOSIS — Z7982 Long term (current) use of aspirin: Secondary | ICD-10-CM | POA: Diagnosis not present

## 2015-06-19 DIAGNOSIS — Z79899 Other long term (current) drug therapy: Secondary | ICD-10-CM | POA: Insufficient documentation

## 2015-06-19 DIAGNOSIS — G20 Parkinson's disease: Secondary | ICD-10-CM | POA: Insufficient documentation

## 2015-06-19 HISTORY — PX: ESOPHAGOGASTRODUODENOSCOPY (EGD) WITH PROPOFOL: SHX5813

## 2015-06-19 SURGERY — ESOPHAGOGASTRODUODENOSCOPY (EGD) WITH PROPOFOL
Anesthesia: General

## 2015-06-19 MED ORDER — PROPOFOL 500 MG/50ML IV EMUL
INTRAVENOUS | Status: DC | PRN
  Administered 2015-06-19: 120 ug/kg/min via INTRAVENOUS

## 2015-06-19 MED ORDER — FENTANYL CITRATE (PF) 100 MCG/2ML IJ SOLN
INTRAMUSCULAR | Status: DC | PRN
  Administered 2015-06-19: 50 ug via INTRAVENOUS

## 2015-06-19 MED ORDER — PROPOFOL 10 MG/ML IV BOLUS
INTRAVENOUS | Status: DC | PRN
  Administered 2015-06-19: 35 mg via INTRAVENOUS

## 2015-06-19 MED ORDER — SODIUM CHLORIDE 0.9 % IV SOLN
INTRAVENOUS | Status: DC
  Administered 2015-06-19: 10:00:00 via INTRAVENOUS

## 2015-06-19 MED ORDER — SODIUM CHLORIDE 0.9 % IV SOLN: INTRAVENOUS | Status: DC

## 2015-06-19 MED ORDER — MIDAZOLAM HCL 5 MG/5ML IJ SOLN
INTRAMUSCULAR | Status: DC | PRN
  Administered 2015-06-19: 1 mg via INTRAVENOUS

## 2015-06-19 NOTE — Anesthesia Preprocedure Evaluation (Signed)
Anesthesia Evaluation  Patient identified by MRN, date of birth, ID band Patient awake    Reviewed: Allergy & Precautions, H&P , NPO status , Patient's Chart, lab work & pertinent test results, reviewed documented beta blocker date and time   Airway Mallampati: II   Neck ROM: full    Dental  (+) Teeth Intact   Pulmonary neg pulmonary ROS,    Pulmonary exam normal        Cardiovascular negative cardio ROS Normal cardiovascular exam     Neuro/Psych negative neurological ROS  negative psych ROS   GI/Hepatic negative GI ROS, Neg liver ROS,   Endo/Other  negative endocrine ROS  Renal/GU negative Renal ROS  negative genitourinary   Musculoskeletal   Abdominal   Peds  Hematology negative hematology ROS (+)   Anesthesia Other Findings Past Medical History:   Parkinson disease ([place])                                      Arthritis                                                  Past Surgical History:   APPENDECTOMY                                                BMI    Body Mass Index   14.21 kg/m 2     Reproductive/Obstetrics                             Anesthesia Physical Anesthesia Plan  ASA: III  Anesthesia Plan: General   Post-op Pain Management:    Induction:   Airway Management Planned:   Additional Equipment:   Intra-op Plan:   Post-operative Plan:   Informed Consent: I have reviewed the patients History and Physical, chart, labs and discussed the procedure including the risks, benefits and alternatives for the proposed anesthesia with the patient or authorized representative who has indicated his/her understanding and acceptance.   Dental Advisory Given  Plan Discussed with: CRNA  Anesthesia Plan Comments:         Anesthesia Quick Evaluation

## 2015-06-19 NOTE — Anesthesia Procedure Notes (Signed)
Date/Time: 06/19/2015 10:04 AM Performed by: Kennon Holter Pre-anesthesia Checklist: Timeout performed, Patient being monitored, Suction available, Emergency Drugs available and Patient identified Patient Re-evaluated:Patient Re-evaluated prior to inductionOxygen Delivery Method: Nasal cannula Preoxygenation: Pre-oxygenation with 100% oxygen Intubation Type: IV induction Placement Confirmation: positive ETCO2

## 2015-06-19 NOTE — Transfer of Care (Signed)
Immediate Anesthesia Transfer of Care Note  Patient: Jillian Vaughn  Procedure(s) Performed: Procedure(s): ESOPHAGOGASTRODUODENOSCOPY (EGD) WITH PROPOFOL (N/A)  Patient Location: PACU and Endoscopy Unit  Anesthesia Type:General  Level of Consciousness: awake, alert  and oriented  Airway & Oxygen Therapy: Patient Spontanous Breathing and Patient connected to nasal cannula oxygen  Post-op Assessment: Report given to RN and Post -op Vital signs reviewed and stable  Post vital signs: Reviewed and stable  Last Vitals:  Filed Vitals:   06/19/15 0933 06/19/15 1024  BP: 156/86   Pulse: 85   Temp: 35.9 C 35.8 C  Resp: 15     Last Pain:  Filed Vitals:   06/19/15 1025  PainSc: 5       Patients Stated Pain Goal: 0 (when taking tylenol) ([id number])  Complications: No apparent anesthesia complications

## 2015-06-19 NOTE — Op Note (Signed)
[facility] Patient Name: Jillian Vaughn Procedure Date: 06/19/2015 10:00 AM MRN: [id number] Account #: [id number] Date of Birth: [birth year] Admit Type: Outpatient Age: 70 Room: [facility] ENDO ROOM 3 Gender: Female Note Status: Finalized Procedure:            Upper GI endoscopy Indications:          Dysphagia Providers:            Lollie Sails, MD Referring MD:         Lenard Simmer, MD (Referring MD) Medicines:            Monitored Anesthesia Care Complications:        No immediate complications. Procedure:            Pre-Anesthesia Assessment:                       - ASA Grade Assessment: III - A patient with severe                        systemic disease.                       After obtaining informed consent, the endoscope was                        passed under direct vision. Throughout the procedure,                        the patient's blood pressure, pulse, and oxygen                        saturations were monitored continuously. The Endoscope                        was introduced through the mouth, and advanced to the                        third part of duodenum. The upper GI endoscopy was                        accomplished without difficulty. The patient tolerated                        the procedure well. Findings:      Abnormal motility was noted in the esophagus. The cricopharyngeus was       normal. There is a decrease in motility of the esophageal body.      The Z-line was variable. Biopsies were taken with a cold forceps for       histology.      Patchy minimal inflammation characterized by adherent blood and       congestion (edema) was found in the gastric body. Biopsies were taken       with a cold forceps for histology. Biopsies were taken with a cold       forceps for histology and Helicobacter pylori testing.      The cardia and gastric fundus were normal on retroflexion.      The exam of the stomach was otherwise  normal.      Diffuse mucosal flattening was found in the entire examined duodenum.       Biopsies were taken  with a cold forceps for histology. Impression:           - Abnormal esophageal motility.                       - Z-line variable. Biopsied.                       - Erosive gastritis. Biopsied.                       - Flattened mucosa was found in the duodenum, not                        consistent with celiac disease. Biopsied. Recommendation:       - Await pathology results.                       - Perform a modified barium swallow at appointment to                        be scheduled.                       - consider esophageal manometry                       - Return to GI clinic in 3 weeks.                       - Use Zantac (ranitidine) 150 mg PO BID. Procedure Code(s):    --- Professional ---                       [phone], Esophagogastroduodenoscopy, flexible, transoral;                        with biopsy, single or multiple Diagnosis Code(s):    --- Professional ---                       K22.4, Dyskinesia of esophagus                       K22.8, Other specified diseases of esophagus                       K29.60, Other gastritis without bleeding                       K31.89, Other diseases of stomach and duodenum                       R13.10, Dysphagia, unspecified CPT copyright 2016 [employer]. All rights reserved. The codes documented in this report are preliminary and upon coder review may  be revised to meet current compliance requirements. Lollie Sails, MD 06/19/2015 10:25:06 AM This report has been signed electronically. Number of Addenda: 0 Note Initiated On: 06/19/2015 10:00 AM      [facility]

## 2015-06-19 NOTE — H&P (Signed)
Outpatient short stay form Pre-procedure 06/19/2015 9:55 AM Lollie Sails MD  Primary Physician: Dr. Ronnald Collum  Reason for visit:  EGD  History of present illness:  Patient is a 70 year old female presenting today for further evaluation regards to dysphagia. She did have a barium esophagram on 05/12/2015 showing a normal examination. The standardized barium tablet passed normally. There is no obstructing abnormality. The esophagus was stated as being widely patent. Relates symptoms of difficulty with swallowing even liquids.  Patient has been losing weight as well.  Current facility-administered medications:  .  0.9 %  sodium chloride infusion, , Intravenous, Continuous, Lollie Sails, MD, Last Rate: 20 mL/hr at 06/19/15 [phone] .  0.9 %  sodium chloride infusion, , Intravenous, Continuous, Lollie Sails, MD  Prescriptions prior to admission  Medication Sig Dispense Refill Last Dose  . amantadine (SYMMETREL) 100 MG capsule Take 100 mg by mouth 2 (two) times daily.  0 06/18/2015 at Unknown time  . carbidopa-levodopa (SINEMET) 25-100 MG tablet Take 1-2 tablets by mouth 4 (four) times daily.   06/18/2015 at Unknown time  . lubiprostone (AMITIZA) 24 MCG capsule Take 24 mcg by mouth daily with breakfast.   06/18/2015 at Unknown time  . pravastatin (PRAVACHOL) 40 MG tablet Take 40 mg by mouth at bedtime.   06/18/2015 at Unknown time  . Riboflavin (VITAMIN B-2) 50 MG TABS Take 50 mg by mouth daily.   06/18/2015 at Unknown time  . rOPINIRole (REQUIP) 0.5 MG tablet Take 0.5 mg by mouth daily.   06/18/2015 at Unknown time  . Vitamin D, Ergocalciferol, (DRISDOL) 50000 units CAPS capsule Take 50,000 Units by mouth every Wednesday.  0 06/18/2015 at Unknown time  . acetaminophen (TYLENOL) 650 MG CR tablet Take 1,300 mg by mouth 2 (two) times daily as needed for pain.   04/27/2015 at Unknown time  . aspirin 325 MG tablet Take 325 mg by mouth daily.   06/12/2015  . meloxicam (MOBIC) 7.5 MG tablet Take 7.5 mg  by mouth daily.   04/27/2015 at Unknown time  . polyethylene glycol-electrolytes (TRILYTE) 420 G solution Take 4,000 mLs by mouth once. (Patient not taking: Reported on 04/27/2015) 4000 mL 0 Not Taking at Unknown time     No Known Allergies   Past Medical History  Diagnosis Date  . Parkinson disease ([place])   . Arthritis     Review of systems:      Physical Exam    Heart and lungs: Regular rate and rhythm without rub or gallop, lungs are bilaterally clear.    HEENT: Normocephalic atraumatic eyes are anicteric    Other:     Pertinant exam for procedure: Soft nontender nondistended bowel sounds positive normoactive.    Planned proceedures: EGD and indicated procedures. I have discussed the risks benefits and complications of procedures to include not limited to bleeding, infection, perforation and the risk of sedation and the patient wishes to proceed. Outpatient short stay form Pre-procedure 06/19/2015 9:57 AM Lollie Sails MD

## 2015-06-19 NOTE — Anesthesia Postprocedure Evaluation (Signed)
Anesthesia Post Note  Patient: YASAIRA ROSENGRANT  Procedure(s) Performed: Procedure(s) (LRB): ESOPHAGOGASTRODUODENOSCOPY (EGD) WITH PROPOFOL (N/A)  Patient location during evaluation: PACU Anesthesia Type: General Level of consciousness: awake and alert Pain management: pain level controlled Vital Signs Assessment: post-procedure vital signs reviewed and stable Respiratory status: spontaneous breathing, nonlabored ventilation, respiratory function stable and patient connected to nasal cannula oxygen Cardiovascular status: blood pressure returned to baseline and stable Postop Assessment: no signs of nausea or vomiting Anesthetic complications: no    Last Vitals:  Filed Vitals:   06/19/15 1044 06/19/15 1054  BP: 145/88 161/78  Pulse: 84 84  Temp:    Resp: 10 15    Last Pain:  Filed Vitals:   06/19/15 1056  PainSc: 5                  Molli Barrows

## 2015-06-20 ENCOUNTER — Encounter: Payer: Self-pay | Admitting: Gastroenterology

## 2015-06-20 LAB — SURGICAL PATHOLOGY

## 2015-06-21 DIAGNOSIS — G20 Parkinson's disease: Secondary | ICD-10-CM | POA: Diagnosis not present

## 2015-06-21 DIAGNOSIS — E7800 Pure hypercholesterolemia, unspecified: Secondary | ICD-10-CM | POA: Diagnosis not present

## 2015-06-21 DIAGNOSIS — G3184 Mild cognitive impairment, so stated: Secondary | ICD-10-CM | POA: Diagnosis not present

## 2015-06-21 DIAGNOSIS — R200 Anesthesia of skin: Secondary | ICD-10-CM | POA: Diagnosis not present

## 2015-06-26 ENCOUNTER — Other Ambulatory Visit: Payer: Self-pay | Admitting: Gastroenterology

## 2015-06-26 ENCOUNTER — Other Ambulatory Visit: Payer: Self-pay | Admitting: Nurse Practitioner

## 2015-06-26 DIAGNOSIS — R1310 Dysphagia, unspecified: Secondary | ICD-10-CM

## 2015-07-06 DIAGNOSIS — M7060 Trochanteric bursitis, unspecified hip: Secondary | ICD-10-CM | POA: Diagnosis not present

## 2015-07-06 DIAGNOSIS — M7061 Trochanteric bursitis, right hip: Secondary | ICD-10-CM | POA: Diagnosis not present

## 2015-07-21 DIAGNOSIS — R634 Abnormal weight loss: Secondary | ICD-10-CM | POA: Diagnosis not present

## 2015-07-21 DIAGNOSIS — D649 Anemia, unspecified: Secondary | ICD-10-CM | POA: Diagnosis not present

## 2015-07-27 ENCOUNTER — Ambulatory Visit: Payer: PPO | Attending: Gastroenterology

## 2015-07-27 ENCOUNTER — Ambulatory Visit: Payer: PPO

## 2015-07-31 DIAGNOSIS — M5416 Radiculopathy, lumbar region: Secondary | ICD-10-CM | POA: Diagnosis not present

## 2015-07-31 DIAGNOSIS — M4806 Spinal stenosis, lumbar region: Secondary | ICD-10-CM | POA: Diagnosis not present

## 2015-08-07 DIAGNOSIS — D649 Anemia, unspecified: Secondary | ICD-10-CM | POA: Diagnosis not present

## 2015-08-10 DIAGNOSIS — M25552 Pain in left hip: Secondary | ICD-10-CM | POA: Diagnosis not present

## 2015-08-10 DIAGNOSIS — E785 Hyperlipidemia, unspecified: Secondary | ICD-10-CM | POA: Diagnosis not present

## 2015-08-10 DIAGNOSIS — G20 Parkinson's disease: Secondary | ICD-10-CM | POA: Diagnosis not present

## 2015-08-10 DIAGNOSIS — H6122 Impacted cerumen, left ear: Secondary | ICD-10-CM | POA: Diagnosis not present

## 2015-08-10 DIAGNOSIS — R079 Chest pain, unspecified: Secondary | ICD-10-CM | POA: Diagnosis not present

## 2015-08-10 DIAGNOSIS — E559 Vitamin D deficiency, unspecified: Secondary | ICD-10-CM | POA: Diagnosis not present

## 2015-08-10 DIAGNOSIS — K5900 Constipation, unspecified: Secondary | ICD-10-CM | POA: Diagnosis not present

## 2015-08-10 DIAGNOSIS — E042 Nontoxic multinodular goiter: Secondary | ICD-10-CM | POA: Diagnosis not present

## 2015-08-10 DIAGNOSIS — M810 Age-related osteoporosis without current pathological fracture: Secondary | ICD-10-CM | POA: Diagnosis not present

## 2015-08-10 DIAGNOSIS — F5101 Primary insomnia: Secondary | ICD-10-CM | POA: Diagnosis not present

## 2015-08-17 DIAGNOSIS — M4806 Spinal stenosis, lumbar region: Secondary | ICD-10-CM | POA: Diagnosis not present

## 2015-08-17 DIAGNOSIS — M7060 Trochanteric bursitis, unspecified hip: Secondary | ICD-10-CM | POA: Diagnosis not present

## 2015-08-17 DIAGNOSIS — M5416 Radiculopathy, lumbar region: Secondary | ICD-10-CM | POA: Diagnosis not present

## 2015-09-14 DIAGNOSIS — G20 Parkinson's disease: Secondary | ICD-10-CM | POA: Diagnosis not present

## 2015-09-14 DIAGNOSIS — M818 Other osteoporosis without current pathological fracture: Secondary | ICD-10-CM | POA: Diagnosis not present

## 2015-09-14 DIAGNOSIS — E559 Vitamin D deficiency, unspecified: Secondary | ICD-10-CM | POA: Diagnosis not present

## 2015-09-14 DIAGNOSIS — E042 Nontoxic multinodular goiter: Secondary | ICD-10-CM | POA: Diagnosis not present

## 2015-09-14 DIAGNOSIS — E785 Hyperlipidemia, unspecified: Secondary | ICD-10-CM | POA: Diagnosis not present

## 2015-09-14 DIAGNOSIS — N951 Menopausal and female climacteric states: Secondary | ICD-10-CM | POA: Diagnosis not present

## 2015-09-25 DIAGNOSIS — M4306 Spondylolysis, lumbar region: Secondary | ICD-10-CM | POA: Diagnosis not present

## 2015-09-25 DIAGNOSIS — M5416 Radiculopathy, lumbar region: Secondary | ICD-10-CM | POA: Diagnosis not present

## 2015-09-25 DIAGNOSIS — M4806 Spinal stenosis, lumbar region: Secondary | ICD-10-CM | POA: Diagnosis not present

## 2015-09-28 ENCOUNTER — Emergency Department
Admission: EM | Admit: 2015-09-28 | Discharge: 2015-09-28 | Disposition: A | Discharge status: Home / Self Care | Payer: PPO | Attending: Emergency Medicine | Admitting: Emergency Medicine

## 2015-09-28 DIAGNOSIS — Z79899 Other long term (current) drug therapy: Secondary | ICD-10-CM | POA: Insufficient documentation

## 2015-09-28 DIAGNOSIS — Y999 Unspecified external cause status: Secondary | ICD-10-CM | POA: Insufficient documentation

## 2015-09-28 DIAGNOSIS — Y9301 Activity, walking, marching and hiking: Secondary | ICD-10-CM | POA: Diagnosis not present

## 2015-09-28 DIAGNOSIS — G20 Parkinson's disease: Secondary | ICD-10-CM | POA: Diagnosis not present

## 2015-09-28 DIAGNOSIS — S00511A Abrasion of lip, initial encounter: Secondary | ICD-10-CM | POA: Insufficient documentation

## 2015-09-28 DIAGNOSIS — W01198A Fall on same level from slipping, tripping and stumbling with subsequent striking against other object, initial encounter: Secondary | ICD-10-CM | POA: Insufficient documentation

## 2015-09-28 DIAGNOSIS — W19XXXA Unspecified fall, initial encounter: Secondary | ICD-10-CM

## 2015-09-28 DIAGNOSIS — Y92009 Unspecified place in unspecified non-institutional (private) residence as the place of occurrence of the external cause: Secondary | ICD-10-CM | POA: Diagnosis not present

## 2015-09-28 MED ORDER — TRIPLE ANTIBIOTIC 3.5-400-5000 EX OINT
TOPICAL_OINTMENT | Freq: Once | CUTANEOUS | Status: AC
  Administered 2015-09-28: 1 via TOPICAL
  Filled 2015-09-28: qty 1

## 2015-09-28 NOTE — ED Provider Notes (Signed)
[facility] Provider Note   CSN: [id number] Arrival date & time: 09/28/15  [id number]  First Provider Contact:  First MD Initiated Contact with Patient 09/28/15 2007        History   Chief Complaint Chief Complaint  Patient presents with  . Laceration    HPI Jillian Vaughn is a 70 y.o. female.Presents to the emergency department for evaluation of fall and lip abrasion. Patient was at home just prior to arrival when she was walking to her car to go get her nails done. She tripped over a brick paver falling down hitting her right upper lip. Patient denies any head trauma, headache, nausea or vomiting. Patient was able to get up and ambulate on her own. She remains ambulatory and denies any headache or neck or lower back pain, hip or lower leg pain. She takes 81 mg of aspirin daily, has a history of Parkinson's disease. Her bleeding has been controlled.  HPI    Past Medical History:  Diagnosis Date  . Arthritis   . Parkinson disease ([place])     There are no active problems to display for this patient.   Past Surgical History:  Procedure Laterality Date  . APPENDECTOMY    . ESOPHAGOGASTRODUODENOSCOPY (EGD) WITH PROPOFOL N/A 06/19/2015   Procedure: ESOPHAGOGASTRODUODENOSCOPY (EGD) WITH PROPOFOL;  Surgeon: Lollie Sails, MD;  [facility] ENDOSCOPY;  Service: Endoscopy;  Laterality: N/A;    OB History    No data available       Home Medications    Prior to Admission medications   Medication Sig Start Date End Date Taking? Authorizing Provider  acetaminophen (TYLENOL) 650 MG CR tablet Take 1,300 mg by mouth 2 (two) times daily as needed for pain.    Historical Provider, MD  amantadine (SYMMETREL) 100 MG capsule Take 100 mg by mouth 2 (two) times daily. 03/23/15   Historical Provider, MD  aspirin 325 MG tablet Take 325 mg by mouth daily.    Historical Provider, MD  carbidopa-levodopa (SINEMET) 25-100 MG tablet Take 1-2 tablets by mouth 4 (four) times daily.  03/02/15   Historical Provider, MD  lubiprostone (AMITIZA) 24 MCG capsule Take 24 mcg by mouth daily with breakfast.    Historical Provider, MD  meloxicam (MOBIC) 7.5 MG tablet Take 7.5 mg by mouth daily.    Historical Provider, MD  polyethylene glycol-electrolytes (TRILYTE) 420 G solution Take 4,000 mLs by mouth once. Patient not taking: Reported on 04/27/2015 10/02/14   Earleen Newport, MD  pravastatin (PRAVACHOL) 40 MG tablet Take 40 mg by mouth at bedtime.    Historical Provider, MD  Riboflavin (VITAMIN B-2) 50 MG TABS Take 50 mg by mouth daily.    Historical Provider, MD  rOPINIRole (REQUIP) 0.5 MG tablet Take 0.5 mg by mouth daily. 03/02/15   Historical Provider, MD  Vitamin D, Ergocalciferol, (DRISDOL) 50000 units CAPS capsule Take 50,000 Units by mouth every Wednesday. 04/18/15   Historical Provider, MD    Family History No family history on file.  Social History Social History  Substance Use Topics  . Smoking status: Never Smoker  . Smokeless tobacco: Never Used  . Alcohol use Yes     Comment: occ     Allergies   Review of patient's allergies indicates no known allergies.   Review of Systems Review of Systems  Constitutional: Negative for chills and fever.  HENT: Negative for ear pain and sore throat.   Eyes: Negative for pain and visual disturbance.  Respiratory: Negative for  cough, chest tightness, shortness of breath and stridor.   Cardiovascular: Negative for chest pain and palpitations.  Gastrointestinal: Negative for abdominal pain and vomiting.  Genitourinary: Negative for dysuria and hematuria.  Musculoskeletal: Negative for arthralgias, back pain, gait problem, joint swelling, myalgias and neck pain.  Skin: Positive for wound (abrasion to right upper lip). Negative for color change and rash.  Neurological: Negative for dizziness, seizures, syncope and headaches.  All other systems reviewed and are negative.    Physical Exam Updated Vital Signs BP (!)  154/84 (BP Location: Left Arm)   Pulse 90   Temp 98.6 F (37 C) (Oral)   Resp 18   Ht 5\' 3"  (1.6 m)   Wt 35.4 kg   SpO2 100%   BMI 13.82 kg/m   Physical Exam  Constitutional: She is oriented to person, place, and time. She appears well-developed and well-nourished. No distress.  HENT:  Head: Normocephalic.  Right Ear: External ear normal.  Left Ear: External ear normal.  Nose: Nose normal.  Mouth/Throat: Oropharynx is clear and moist.  Small abrasion to the right upper lip, 1 cm in length, no signs of laceration. No sign of foreign body. Teeth are intact with no signs of loosening or fracture. She has no sign of other signs of trauma to the head or skull.  Eyes: EOM are normal. Pupils are equal, round, and reactive to light. Right eye exhibits no discharge. Left eye exhibits no discharge.  Neck: Normal range of motion. Neck supple.  Cardiovascular: Normal rate, regular rhythm and intact distal pulses.   Pulmonary/Chest: Effort normal and breath sounds normal. No respiratory distress. She exhibits no tenderness.  Abdominal: Soft. She exhibits no distension. There is no tenderness.  Musculoskeletal: Normal range of motion. She exhibits no edema.  Shoulders elbows and wrists, hips knees and ankles. She has good balance with standing. She has no tenderness to palpation along the spinous process of the cervical thoracic or lumbar spine. She has normal range of motion of the cervical thoracic and lumbar spine. No signs of bruising throughout the upper or lower extremities.  Neurological: She is alert and oriented to person, place, and time. She has normal reflexes.  Skin: Skin is warm and dry.  Psychiatric: She has a normal mood and affect. Her behavior is normal. Thought content normal.     ED Treatments / Results  Labs (all labs ordered are listed, but only abnormal results are displayed) Labs Reviewed - No data to display  EKG  EKG Interpretation None       Radiology No  results found.  Procedures Procedures (including critical care time)  Medications Ordered in ED Medications  TRIPLE ANTIBIOTIC [phone] OINT (not administered)     Initial Impression / Assessment and Plan / ED Course  I have reviewed the triage vital signs and the nursing notes.  Pertinent labs & imaging results that were available during my care of the patient were reviewed by me and considered in my medical decision making (see chart for details).  Clinical Course    70 year old female fell just prior to arrival, injuring her right upper lip. There was no other head trauma or injury. She denies any other injury to her body. She is ambulatory. Patient is pain free. Patient's husband and patient were given information on red flags to return to the emergency department for. Anabiotic one that was applied to the abrasion, they will keep clean and apply Neosporin daily.  Final Clinical Impressions(s) / ED Diagnoses  Final diagnoses:  Lip abrasion, initial encounter  Fall at home, initial encounter    New Prescriptions New Prescriptions   No medications on file     Duanne Guess, Hershal Coria 09/28/15 2037    Lisa Roca, MD 09/29/15 1331

## 2015-09-28 NOTE — ED Triage Notes (Signed)
Pt tripped on a curve and fell hitting mouth on floor. Small lac noted to right upper, no co pain at this time.

## 2015-09-28 NOTE — ED Notes (Signed)
Pt reports she tripped over bricks and fell and hit her face and knees at approx 1500 today. Pt denies LOC, dizziness/nausea. Pt has small abrasion to left knee, and laceration above right lip.

## 2015-10-04 DIAGNOSIS — E042 Nontoxic multinodular goiter: Secondary | ICD-10-CM | POA: Diagnosis not present

## 2015-10-04 DIAGNOSIS — E785 Hyperlipidemia, unspecified: Secondary | ICD-10-CM | POA: Diagnosis not present

## 2015-10-04 DIAGNOSIS — M810 Age-related osteoporosis without current pathological fracture: Secondary | ICD-10-CM | POA: Diagnosis not present

## 2015-10-04 DIAGNOSIS — D259 Leiomyoma of uterus, unspecified: Secondary | ICD-10-CM | POA: Diagnosis not present

## 2015-10-04 DIAGNOSIS — F5101 Primary insomnia: Secondary | ICD-10-CM | POA: Diagnosis not present

## 2015-10-04 DIAGNOSIS — R634 Abnormal weight loss: Secondary | ICD-10-CM | POA: Diagnosis not present

## 2015-10-04 DIAGNOSIS — N951 Menopausal and female climacteric states: Secondary | ICD-10-CM | POA: Diagnosis not present

## 2015-10-04 DIAGNOSIS — G20 Parkinson's disease: Secondary | ICD-10-CM | POA: Diagnosis not present

## 2015-10-04 DIAGNOSIS — E559 Vitamin D deficiency, unspecified: Secondary | ICD-10-CM | POA: Diagnosis not present

## 2015-10-04 DIAGNOSIS — R071 Chest pain on breathing: Secondary | ICD-10-CM | POA: Diagnosis not present

## 2015-10-04 DIAGNOSIS — K5900 Constipation, unspecified: Secondary | ICD-10-CM | POA: Diagnosis not present

## 2015-10-04 DIAGNOSIS — N6019 Diffuse cystic mastopathy of unspecified breast: Secondary | ICD-10-CM | POA: Diagnosis not present

## 2015-10-25 DIAGNOSIS — G20 Parkinson's disease: Secondary | ICD-10-CM | POA: Diagnosis not present

## 2015-10-28 DIAGNOSIS — R55 Syncope and collapse: Secondary | ICD-10-CM | POA: Diagnosis not present

## 2015-10-29 ENCOUNTER — Encounter: Payer: Self-pay | Admitting: Emergency Medicine

## 2015-10-29 ENCOUNTER — Emergency Department: Payer: PPO

## 2015-10-29 ENCOUNTER — Emergency Department
Admission: EM | Admit: 2015-10-29 | Discharge: 2015-10-29 | Disposition: A | Discharge status: Home / Self Care | Payer: PPO | Attending: Emergency Medicine | Admitting: Emergency Medicine

## 2015-10-29 DIAGNOSIS — G20 Parkinson's disease: Secondary | ICD-10-CM | POA: Diagnosis not present

## 2015-10-29 DIAGNOSIS — Z79899 Other long term (current) drug therapy: Secondary | ICD-10-CM | POA: Diagnosis not present

## 2015-10-29 DIAGNOSIS — R4182 Altered mental status, unspecified: Secondary | ICD-10-CM | POA: Diagnosis not present

## 2015-10-29 DIAGNOSIS — R55 Syncope and collapse: Secondary | ICD-10-CM | POA: Diagnosis not present

## 2015-10-29 DIAGNOSIS — Z7982 Long term (current) use of aspirin: Secondary | ICD-10-CM | POA: Diagnosis not present

## 2015-10-29 LAB — URINALYSIS COMPLETE WITH MICROSCOPIC (ARMC ONLY)
Bacteria, UA: NONE SEEN
Bilirubin Urine: NEGATIVE
GLUCOSE, UA: NEGATIVE mg/dL
KETONES UR: NEGATIVE mg/dL
Leukocytes, UA: NEGATIVE
NITRITE: NEGATIVE
PROTEIN: NEGATIVE mg/dL
SPECIFIC GRAVITY, URINE: 1.009 (ref 1.005–1.030)
Squamous Epithelial / LPF: NONE SEEN
pH: 5 (ref 5.0–8.0)

## 2015-10-29 LAB — COMPREHENSIVE METABOLIC PANEL
ALK PHOS: 58 U/L (ref 38–126)
ALT: 8 U/L — AB (ref 14–54)
AST: 33 U/L (ref 15–41)
Albumin: 4.3 g/dL (ref 3.5–5.0)
Anion gap: 8 (ref 5–15)
BUN: 37 mg/dL — AB (ref 6–20)
CALCIUM: 9.3 mg/dL (ref 8.9–10.3)
CHLORIDE: 109 mmol/L (ref 101–111)
CO2: 23 mmol/L (ref 22–32)
Creatinine, Ser: 1.09 mg/dL — ABNORMAL HIGH (ref 0.44–1.00)
GFR, EST AFRICAN AMERICAN: 57 mL/min — AB (ref >60)
GFR, EST NON AFRICAN AMERICAN: 49 mL/min — AB (ref >60)
Glucose, Bld: 104 mg/dL — ABNORMAL HIGH (ref 65–99)
Potassium: 4 mmol/L (ref 3.5–5.1)
Sodium: 140 mmol/L (ref 135–145)
Total Bilirubin: 0.6 mg/dL (ref 0.3–1.2)
Total Protein: 6.9 g/dL (ref 6.5–8.1)

## 2015-10-29 LAB — CBC
HEMATOCRIT: 32.9 % — AB (ref 35.0–47.0)
Hemoglobin: 11.5 g/dL — ABNORMAL LOW (ref 12.0–16.0)
MCH: 34 pg (ref 26.0–34.0)
MCHC: 35.1 g/dL (ref 32.0–36.0)
MCV: 97 fL (ref 80.0–100.0)
Platelets: 225 10*3/uL (ref 150–440)
RBC: 3.39 MIL/uL — ABNORMAL LOW (ref 3.80–5.20)
RDW: 12.9 % (ref 11.5–14.5)
WBC: 8.1 10*3/uL (ref 3.6–11.0)

## 2015-10-29 LAB — TROPONIN I

## 2015-10-29 NOTE — ED Notes (Signed)
Patient observed resting in bed with NAD noted. No pending orders noted in [facility]. Warm blankets have been provided several times throughout the night by staff. Patient's husband upset about wait times and asking about discharge POC; MD made aware and will present to bedside to speak with patient and her husband.

## 2015-10-29 NOTE — ED Triage Notes (Signed)
Pt arrived via [facility] from home. Husband told EMS that pt was unresponsive for 1-2 hours earlier today. Pt has advanced Parkinson's. Pt is alert and oriented upon arrival to ER, however sleepy. Pt has no complaints at time of triage. Denies falling today. Denies pain at present.

## 2015-10-29 NOTE — ED Notes (Signed)

## 2015-10-30 NOTE — ED Provider Notes (Signed)
[facility] Emergency Department Provider Note    First MD Initiated Contact with Patient 10/29/15 0037     (approximate)  I have reviewed the triage vital signs and the nursing notes.   HISTORY  Chief Complaint Loss of Consciousness    HPI Jillian Vaughn is a 70 y.o. female with history of Parkinson's disease presents to the emergency department via [facility] from home. Per the patient's husband at approximately 10 PM last night the patient was unresponsive for 1-2 hours. Patient's husband states that while the patient's eyes were open she would not respond to him. Patient denies any recollection of that time she states that she heard her husband talking to her. Patient has no complaints at this time she denies any headache no pain at all, no weakness numbness gait instability.   Past Medical History:  Diagnosis Date  . Arthritis   . Parkinson disease ([place])     There are no active problems to display for this patient.   Past Surgical History:  Procedure Laterality Date  . APPENDECTOMY    . ESOPHAGOGASTRODUODENOSCOPY (EGD) WITH PROPOFOL N/A 06/19/2015   Procedure: ESOPHAGOGASTRODUODENOSCOPY (EGD) WITH PROPOFOL;  Surgeon: Lollie Sails, MD;  Location: [facility] ENDOSCOPY;  Service: Endoscopy;  Laterality: N/A;    Prior to Admission medications   Medication Sig Start Date End Date Taking? Authorizing Provider  acetaminophen (TYLENOL) 650 MG CR tablet Take 1,300 mg by mouth 2 (two) times daily as needed for pain.    Historical Provider, MD  amantadine (SYMMETREL) 100 MG capsule Take 100 mg by mouth 2 (two) times daily. 03/23/15   Historical Provider, MD  aspirin 325 MG tablet Take 325 mg by mouth daily.    Historical Provider, MD  carbidopa-levodopa (SINEMET) 25-100 MG tablet Take 1-2 tablets by mouth 4 (four) times daily. 03/02/15   Historical Provider, MD  lubiprostone (AMITIZA) 24 MCG capsule Take 24 mcg by mouth daily with breakfast.     Historical Provider, MD  meloxicam (MOBIC) 7.5 MG tablet Take 7.5 mg by mouth daily.    Historical Provider, MD  mirtazapine (REMERON) 7.5 MG tablet Take 7.5 mg by mouth at bedtime.    Historical Provider, MD  polyethylene glycol-electrolytes (TRILYTE) 420 G solution Take 4,000 mLs by mouth once. Patient not taking: Reported on 04/27/2015 10/02/14   Earleen Newport, MD  pravastatin (PRAVACHOL) 40 MG tablet Take 40 mg by mouth at bedtime.    Historical Provider, MD  Riboflavin (VITAMIN B-2) 50 MG TABS Take 50 mg by mouth daily.    Historical Provider, MD  rOPINIRole (REQUIP) 0.5 MG tablet Take 0.5 mg by mouth daily. 03/02/15   Historical Provider, MD  Vitamin D, Ergocalciferol, (DRISDOL) 50000 units CAPS capsule Take 50,000 Units by mouth every Wednesday. 04/18/15   Historical Provider, MD    Allergies No known drug allergies History reviewed. No pertinent family history.  Social History Social History  Substance Use Topics  . Smoking status: Never Smoker  . Smokeless tobacco: Never Used  . Alcohol use Yes     Comment: occ    Review of Systems Constitutional: No fever/chills Eyes: No visual changes. ENT: No sore throat. Cardiovascular: Denies chest pain. Respiratory: Denies shortness of breath. Gastrointestinal: No abdominal pain.  No nausea, no vomiting.  No diarrhea.  No constipation. Genitourinary: Negative for dysuria. Musculoskeletal: Negative for back pain. Skin: Negative for rash. Neurological: Negative for headaches, focal weakness or numbness.  10-point ROS otherwise negative.  ____________________________________________  PHYSICAL EXAM:  VITAL SIGNS: ED Triage Vitals  Enc Vitals Group     BP 10/29/15 0008 (!) 150/77     Pulse Rate 10/29/15 0008 90     Resp 10/29/15 0008 13     Temp 10/29/15 0008 98.1 F (36.7 C)     Temp Source 10/29/15 0008 Oral     SpO2 10/29/15 0008 99 %     Weight 10/29/15 0011 77 lb (34.9 kg)     Height 10/29/15 0011 5\' 2"  (1.575 m)      Head Circumference --      Peak Flow --      Pain Score 10/29/15 0011 0     Pain Loc --      Pain Edu? --      Excl. in [place]? --     Constitutional: Alert and oriented. Well appearing and in no acute distress. Eyes: Conjunctivae are normal. PERRL. EOMI. Head: Atraumatic. Mouth/Throat: Mucous membranes are moist.  Oropharynx non-erythematous. Neck: No stridor.  No meningeal signs.  No cervical spine tenderness to palpation. Cardiovascular: Normal rate, regular rhythm. Good peripheral circulation. Grossly normal heart sounds. Respiratory: Normal respiratory effort.  No retractions. Lungs CTAB. Gastrointestinal: Soft and nontender. No distention.  Musculoskeletal: No lower extremity tenderness nor edema. No gross deformities of extremities. Neurologic:  Normal speech and language. No gross focal neurologic deficits are appreciated.  Skin:  Skin is warm, dry and intact. No rash noted. Psychiatric: Mood and affect are normal. Speech and behavior are normal.  ____________________________________________   LABS (all labs ordered are listed, but only abnormal results are displayed)  Labs Reviewed  CBC - Abnormal; Notable for the following:       Result Value   RBC 3.39 (*)    Hemoglobin 11.5 (*)    HCT 32.9 (*)    All other components within normal limits  COMPREHENSIVE METABOLIC PANEL - Abnormal; Notable for the following:    Glucose, Bld 104 (*)    BUN 37 (*)    Creatinine, Ser 1.09 (*)    ALT 8 (*)    GFR calc non Af Amer 49 (*)    GFR calc Af Amer 57 (*)    All other components within normal limits  URINALYSIS COMPLETEWITH MICROSCOPIC ([facility] ONLY) - Abnormal; Notable for the following:    Color, Urine STRAW (*)    APPearance CLEAR (*)    Hgb urine dipstick 1+ (*)    All other components within normal limits  TROPONIN I    RADIOLOGY I, [place] Lachrista Heslin, personally viewed and evaluated these images (plain radiographs) as part of my medical decision making, as well as  reviewing the written report by the radiologist.  CLINICAL DATA:  70 year old female with unresponsiveness.  EXAM: CT HEAD WITHOUT CONTRAST  TECHNIQUE: Contiguous axial images were obtained from the base of the skull through the vertex without intravenous contrast.  COMPARISON:  None.  FINDINGS: The ventricles and sulci appropriate size for patient's age. Minimal periventricular and deep white matter chronic microvascular ischemic changes noted. 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 There is no acute intracranial hemorrhage. No mass effect or midline shift noted. No extra-axial fluid collection.  The visualized paranasal sinuses and mastoid air cells are clear. The calvarium is intact.  IMPRESSION: No acute intracranial pathology.   Electronically Signed   By: Anner Crete M.D.   On: 10/29/2015 02:00   Procedures     INITIAL IMPRESSION / ASSESSMENT AND PLAN /  ED COURSE  Pertinent labs & imaging results that were available during my care of the patient were reviewed by me and considered in my medical decision making (see chart for details).  No clear etiology for the patient's period of minimal responsiveness discovered in the emergency department. CT scan of the head unremarkable laboratory data likewise. Spoke with the patient's husband at length regarding importance of follow-up with primary care provider for further outpatient evaluation.   Clinical Course    ____________________________________________  FINAL CLINICAL IMPRESSION(S) / ED DIAGNOSES  Final diagnoses:  Altered mental status, unspecified altered mental status type     MEDICATIONS GIVEN DURING THIS VISIT:  Medications - No data to display   NEW OUTPATIENT MEDICATIONS STARTED DURING THIS VISIT:  Discharge Medication List as of 10/29/2015  5:46 AM      Discharge Medication List as of 10/29/2015  5:46 AM      Discharge Medication List as of 10/29/2015  5:46  AM       Note:  This document was prepared using Dragon voice recognition software and may include unintentional dictation errors.    Gregor Hams, MD 10/30/15 2147

## 2016-01-15 DIAGNOSIS — M25552 Pain in left hip: Secondary | ICD-10-CM | POA: Diagnosis not present

## 2016-01-15 DIAGNOSIS — E559 Vitamin D deficiency, unspecified: Secondary | ICD-10-CM | POA: Diagnosis not present

## 2016-01-15 DIAGNOSIS — E042 Nontoxic multinodular goiter: Secondary | ICD-10-CM | POA: Diagnosis not present

## 2016-01-15 DIAGNOSIS — G20 Parkinson's disease: Secondary | ICD-10-CM | POA: Diagnosis not present

## 2016-01-15 DIAGNOSIS — D259 Leiomyoma of uterus, unspecified: Secondary | ICD-10-CM | POA: Diagnosis not present

## 2016-01-15 DIAGNOSIS — E785 Hyperlipidemia, unspecified: Secondary | ICD-10-CM | POA: Diagnosis not present

## 2016-01-15 DIAGNOSIS — N951 Menopausal and female climacteric states: Secondary | ICD-10-CM | POA: Diagnosis not present

## 2016-01-15 DIAGNOSIS — N6019 Diffuse cystic mastopathy of unspecified breast: Secondary | ICD-10-CM | POA: Diagnosis not present

## 2016-01-15 DIAGNOSIS — M810 Age-related osteoporosis without current pathological fracture: Secondary | ICD-10-CM | POA: Diagnosis not present

## 2016-01-15 DIAGNOSIS — F5101 Primary insomnia: Secondary | ICD-10-CM | POA: Diagnosis not present

## 2016-01-15 DIAGNOSIS — K5900 Constipation, unspecified: Secondary | ICD-10-CM | POA: Diagnosis not present

## 2016-01-18 DIAGNOSIS — R079 Chest pain, unspecified: Secondary | ICD-10-CM | POA: Diagnosis not present

## 2016-01-18 DIAGNOSIS — F5101 Primary insomnia: Secondary | ICD-10-CM | POA: Diagnosis not present

## 2016-01-18 DIAGNOSIS — M25552 Pain in left hip: Secondary | ICD-10-CM | POA: Diagnosis not present

## 2016-01-18 DIAGNOSIS — R071 Chest pain on breathing: Secondary | ICD-10-CM | POA: Diagnosis not present

## 2016-01-18 DIAGNOSIS — K5900 Constipation, unspecified: Secondary | ICD-10-CM | POA: Diagnosis not present

## 2016-01-18 DIAGNOSIS — G20 Parkinson's disease: Secondary | ICD-10-CM | POA: Diagnosis not present

## 2016-01-18 DIAGNOSIS — E785 Hyperlipidemia, unspecified: Secondary | ICD-10-CM | POA: Diagnosis not present

## 2016-01-18 DIAGNOSIS — E042 Nontoxic multinodular goiter: Secondary | ICD-10-CM | POA: Diagnosis not present

## 2016-01-18 DIAGNOSIS — R634 Abnormal weight loss: Secondary | ICD-10-CM | POA: Diagnosis not present

## 2016-01-18 DIAGNOSIS — H26491 Other secondary cataract, right eye: Secondary | ICD-10-CM | POA: Diagnosis not present

## 2016-01-18 DIAGNOSIS — N6019 Diffuse cystic mastopathy of unspecified breast: Secondary | ICD-10-CM | POA: Diagnosis not present

## 2016-01-18 DIAGNOSIS — D259 Leiomyoma of uterus, unspecified: Secondary | ICD-10-CM | POA: Diagnosis not present

## 2016-01-18 DIAGNOSIS — M810 Age-related osteoporosis without current pathological fracture: Secondary | ICD-10-CM | POA: Diagnosis not present

## 2016-01-24 DIAGNOSIS — H26491 Other secondary cataract, right eye: Secondary | ICD-10-CM | POA: Diagnosis not present

## 2016-04-17 DIAGNOSIS — M810 Age-related osteoporosis without current pathological fracture: Secondary | ICD-10-CM | POA: Diagnosis not present

## 2016-04-17 DIAGNOSIS — N951 Menopausal and female climacteric states: Secondary | ICD-10-CM | POA: Diagnosis not present

## 2016-04-17 DIAGNOSIS — G20 Parkinson's disease: Secondary | ICD-10-CM | POA: Diagnosis not present

## 2016-04-17 DIAGNOSIS — R634 Abnormal weight loss: Secondary | ICD-10-CM | POA: Diagnosis not present

## 2016-04-24 DIAGNOSIS — K5900 Constipation, unspecified: Secondary | ICD-10-CM | POA: Diagnosis not present

## 2016-04-24 DIAGNOSIS — E559 Vitamin D deficiency, unspecified: Secondary | ICD-10-CM | POA: Diagnosis not present

## 2016-04-24 DIAGNOSIS — N3031 Trigonitis with hematuria: Secondary | ICD-10-CM | POA: Diagnosis not present

## 2016-04-24 DIAGNOSIS — R634 Abnormal weight loss: Secondary | ICD-10-CM | POA: Diagnosis not present

## 2016-04-24 DIAGNOSIS — E785 Hyperlipidemia, unspecified: Secondary | ICD-10-CM | POA: Diagnosis not present

## 2016-04-24 DIAGNOSIS — M810 Age-related osteoporosis without current pathological fracture: Secondary | ICD-10-CM | POA: Diagnosis not present

## 2016-04-24 DIAGNOSIS — G20 Parkinson's disease: Secondary | ICD-10-CM | POA: Diagnosis not present

## 2016-04-24 DIAGNOSIS — E042 Nontoxic multinodular goiter: Secondary | ICD-10-CM | POA: Diagnosis not present

## 2016-04-24 DIAGNOSIS — D259 Leiomyoma of uterus, unspecified: Secondary | ICD-10-CM | POA: Diagnosis not present

## 2016-05-07 DIAGNOSIS — E042 Nontoxic multinodular goiter: Secondary | ICD-10-CM | POA: Diagnosis not present

## 2016-05-07 DIAGNOSIS — N3031 Trigonitis with hematuria: Secondary | ICD-10-CM | POA: Diagnosis not present

## 2016-05-07 DIAGNOSIS — M810 Age-related osteoporosis without current pathological fracture: Secondary | ICD-10-CM | POA: Diagnosis not present

## 2016-05-07 DIAGNOSIS — G20 Parkinson's disease: Secondary | ICD-10-CM | POA: Diagnosis not present

## 2016-05-07 DIAGNOSIS — E559 Vitamin D deficiency, unspecified: Secondary | ICD-10-CM | POA: Diagnosis not present

## 2016-05-07 DIAGNOSIS — D259 Leiomyoma of uterus, unspecified: Secondary | ICD-10-CM | POA: Diagnosis not present

## 2016-05-07 DIAGNOSIS — E785 Hyperlipidemia, unspecified: Secondary | ICD-10-CM | POA: Diagnosis not present

## 2016-05-07 DIAGNOSIS — M5137 Other intervertebral disc degeneration, lumbosacral region: Secondary | ICD-10-CM | POA: Diagnosis not present

## 2016-05-07 DIAGNOSIS — N3001 Acute cystitis with hematuria: Secondary | ICD-10-CM | POA: Diagnosis not present

## 2016-05-07 DIAGNOSIS — N3011 Interstitial cystitis (chronic) with hematuria: Secondary | ICD-10-CM | POA: Diagnosis not present

## 2016-05-07 DIAGNOSIS — M545 Low back pain: Secondary | ICD-10-CM | POA: Diagnosis not present

## 2016-05-07 DIAGNOSIS — R634 Abnormal weight loss: Secondary | ICD-10-CM | POA: Diagnosis not present

## 2016-05-15 DIAGNOSIS — M48062 Spinal stenosis, lumbar region with neurogenic claudication: Secondary | ICD-10-CM | POA: Diagnosis not present

## 2016-05-15 DIAGNOSIS — M48061 Spinal stenosis, lumbar region without neurogenic claudication: Secondary | ICD-10-CM | POA: Diagnosis not present

## 2016-05-22 DIAGNOSIS — D259 Leiomyoma of uterus, unspecified: Secondary | ICD-10-CM | POA: Diagnosis not present

## 2016-05-22 DIAGNOSIS — E559 Vitamin D deficiency, unspecified: Secondary | ICD-10-CM | POA: Diagnosis not present

## 2016-05-22 DIAGNOSIS — M810 Age-related osteoporosis without current pathological fracture: Secondary | ICD-10-CM | POA: Diagnosis not present

## 2016-05-22 DIAGNOSIS — N3031 Trigonitis with hematuria: Secondary | ICD-10-CM | POA: Diagnosis not present

## 2016-05-22 DIAGNOSIS — G20 Parkinson's disease: Secondary | ICD-10-CM | POA: Diagnosis not present

## 2016-05-22 DIAGNOSIS — R634 Abnormal weight loss: Secondary | ICD-10-CM | POA: Diagnosis not present

## 2016-05-22 DIAGNOSIS — N3001 Acute cystitis with hematuria: Secondary | ICD-10-CM | POA: Diagnosis not present

## 2016-05-22 DIAGNOSIS — E042 Nontoxic multinodular goiter: Secondary | ICD-10-CM | POA: Diagnosis not present

## 2016-05-22 DIAGNOSIS — M5430 Sciatica, unspecified side: Secondary | ICD-10-CM | POA: Diagnosis not present

## 2016-05-22 DIAGNOSIS — E785 Hyperlipidemia, unspecified: Secondary | ICD-10-CM | POA: Diagnosis not present

## 2016-05-31 ENCOUNTER — Ambulatory Visit: Payer: PPO | Admitting: Dietician

## 2016-06-14 ENCOUNTER — Encounter: Payer: Self-pay | Admitting: Dietician

## 2016-06-14 ENCOUNTER — Encounter: Payer: PPO | Attending: Endocrinology | Admitting: Dietician

## 2016-06-14 VITALS — Ht 63.0 in | Wt 78.7 lb

## 2016-06-14 DIAGNOSIS — Z681 Body mass index (BMI) 19 or less, adult: Secondary | ICD-10-CM | POA: Insufficient documentation

## 2016-06-14 DIAGNOSIS — R634 Abnormal weight loss: Secondary | ICD-10-CM | POA: Diagnosis not present

## 2016-06-14 DIAGNOSIS — R627 Adult failure to thrive: Secondary | ICD-10-CM | POA: Diagnosis not present

## 2016-06-14 DIAGNOSIS — Z713 Dietary counseling and surveillance: Secondary | ICD-10-CM | POA: Diagnosis not present

## 2016-06-14 NOTE — Patient Instructions (Addendum)
-   Start incorporating more snacks throughout the day (mid morning, afternoon, or evening). Example: yogurt, crackers, dried fruit, smoothies.  - Continue to drink Boost at least once per day, 2-3 times per day ideally.  - Think of ways to add calories and insoluble fiber to meals (example: make oats with heavy cream instead of water or milk).

## 2016-06-14 NOTE — Progress Notes (Signed)
Medical Nutrition Therapy: Visit start time: 1330  end time: 1430  Assessment:  Diagnosis: adult failure to thrive, unintentional weight loss Past medical history: Parkinson's disease, see chart Psychosocial issues/ stress concerns: none Preferred learning method:  . No preference indicated  Current weight: 78.7lb  Height: 5' 3"  Medications, supplements: L-dopa, mirtazapine, cipro, Vit D, mobic, see chart for full listing  Progress and evaluation: Patient reports to have lost 4# since last medical appointment (04/24/16). BMI: 13.94- underweight, at high risk for malnutrition. Per her report, goal wt is 110#. Interventions for wt gain previously put in place by patient and husband include: supplementing with Boost drinks at least 1x/day, making milkshakes using either milk or Boost shake as the liquid, more frequent consumption of sweets she enjoys such as ice cream or pie. She reports current goal of increasing milkshake consumption to 5x/wk and agrees to increase consumption of Boost supplements particularly as a snack between meals d/t not regularly snacking. Also reports to have stopped taking Vit D supplement but may start re-taking it. Her husband and sisters are very supportive and help with ADLs/ meal prep often.  Physical activity: none  Dietary Intake:  Usual eating pattern includes 2-3 meals and 1-2 snacks per day. Dining out frequency: 9 meals per week.  Breakfast: does not care for breakfast, husband frequently brings home [place] biscuit or muffin Snack: Boost, shake (banana, boost or milk, ice cream) Lunch: sandwich (chicken typically), chick fil a frequently Snack: not usually Supper: gets take out a lot. Usual places: [employer] & [employer]. May order chicken meal with veggies, greens, mac & cheese, potatoes, chicken pot pies Snack: ice cream or pie, Boost Beverages: cappuccino, water, juice at breakfast  Nutrition Care Education: Topics covered: dietary  interventions for gaining weight with Parkinson's disease. Fiber and Parkinson's- insoluble fiber in particular. Eating small meals/ snacks throughout the day. Basic nutrition: basic food groups, appropriate nutrient balance, appropriate meal and snack schedule    Weight gain: benefits of weight gain, identifying healthy weight, determining reasonable weight goal, behavioral changes for weight gain Advanced nutrition:  recipe modification, cooking techniques, dining out Other lifestyle changes:  benefits of making changes, increasing motivation, readiness for change, identifying habits that need to change, food and drug interactions  Nutritional Diagnosis:  [place]-3.2 Unintentional weight loss As related to Parkinson's Disease.  As evidenced by BMI of 13.9.  Intervention: Discussion as noted above. Goals were created for patient and husband to work on until next visit. Coupons for nutritional supplements such as Boost, Pronourish and [employer] were provided. Follow up scheduled in 4 weeks.  Education Materials given:  Marland Kitchen Fiber handout . Ideas for gaining weight handout . Goals/ instructions  Learner/ who was taught:  . Patient  . Spouse/ partner: Husband  Level of understanding: Marland Kitchen Verbalizes/ demonstrates competency  Demonstrated degree of understanding via:   Teach back Learning barriers: . Physical limitations: Parkinson's Disease  Willingness to learn/ readiness for change: . Acceptance, ready for change  Monitoring and Evaluation:  Dietary intake and body weight      follow up: in 4 week(s)

## 2016-07-12 ENCOUNTER — Ambulatory Visit: Payer: PPO | Admitting: Dietician

## 2016-07-18 DIAGNOSIS — G20 Parkinson's disease: Secondary | ICD-10-CM | POA: Diagnosis not present

## 2016-07-19 ENCOUNTER — Ambulatory Visit: Payer: PPO | Admitting: Dietician

## 2016-07-19 ENCOUNTER — Telehealth: Payer: Self-pay | Admitting: Dietician

## 2016-07-19 NOTE — Telephone Encounter (Signed)
Attempted to call patient, as she canceled her re-scheduled follow up appointment that was scheduled for today, June 1st. No answer upon call.

## 2016-07-29 DIAGNOSIS — M5430 Sciatica, unspecified side: Secondary | ICD-10-CM | POA: Diagnosis not present

## 2016-07-29 DIAGNOSIS — E042 Nontoxic multinodular goiter: Secondary | ICD-10-CM | POA: Diagnosis not present

## 2016-07-29 DIAGNOSIS — E785 Hyperlipidemia, unspecified: Secondary | ICD-10-CM | POA: Diagnosis not present

## 2016-07-29 DIAGNOSIS — R634 Abnormal weight loss: Secondary | ICD-10-CM | POA: Diagnosis not present

## 2016-07-29 DIAGNOSIS — E559 Vitamin D deficiency, unspecified: Secondary | ICD-10-CM | POA: Diagnosis not present

## 2016-07-29 DIAGNOSIS — N3031 Trigonitis with hematuria: Secondary | ICD-10-CM | POA: Diagnosis not present

## 2016-07-29 DIAGNOSIS — M810 Age-related osteoporosis without current pathological fracture: Secondary | ICD-10-CM | POA: Diagnosis not present

## 2016-07-29 DIAGNOSIS — G20 Parkinson's disease: Secondary | ICD-10-CM | POA: Diagnosis not present

## 2016-07-29 DIAGNOSIS — K5900 Constipation, unspecified: Secondary | ICD-10-CM | POA: Diagnosis not present

## 2016-07-29 DIAGNOSIS — D259 Leiomyoma of uterus, unspecified: Secondary | ICD-10-CM | POA: Diagnosis not present

## 2016-09-20 ENCOUNTER — Encounter: Payer: Self-pay | Admitting: Dietician

## 2016-10-02 DIAGNOSIS — E785 Hyperlipidemia, unspecified: Secondary | ICD-10-CM | POA: Diagnosis not present

## 2016-10-02 DIAGNOSIS — G20 Parkinson's disease: Secondary | ICD-10-CM | POA: Diagnosis not present

## 2016-10-02 DIAGNOSIS — M810 Age-related osteoporosis without current pathological fracture: Secondary | ICD-10-CM | POA: Diagnosis not present

## 2016-10-02 DIAGNOSIS — R634 Abnormal weight loss: Secondary | ICD-10-CM | POA: Diagnosis not present

## 2016-10-02 DIAGNOSIS — E559 Vitamin D deficiency, unspecified: Secondary | ICD-10-CM | POA: Diagnosis not present

## 2016-10-02 DIAGNOSIS — M545 Low back pain: Secondary | ICD-10-CM | POA: Diagnosis not present

## 2016-10-02 DIAGNOSIS — D259 Leiomyoma of uterus, unspecified: Secondary | ICD-10-CM | POA: Diagnosis not present

## 2016-10-02 DIAGNOSIS — M5137 Other intervertebral disc degeneration, lumbosacral region: Secondary | ICD-10-CM | POA: Diagnosis not present

## 2016-10-02 DIAGNOSIS — N3001 Acute cystitis with hematuria: Secondary | ICD-10-CM | POA: Diagnosis not present

## 2016-10-02 DIAGNOSIS — E042 Nontoxic multinodular goiter: Secondary | ICD-10-CM | POA: Diagnosis not present

## 2017-01-06 DIAGNOSIS — H25813 Combined forms of age-related cataract, bilateral: Secondary | ICD-10-CM | POA: Diagnosis not present

## 2017-01-21 DIAGNOSIS — E559 Vitamin D deficiency, unspecified: Secondary | ICD-10-CM | POA: Diagnosis not present

## 2017-01-21 DIAGNOSIS — K5900 Constipation, unspecified: Secondary | ICD-10-CM | POA: Diagnosis not present

## 2017-01-21 DIAGNOSIS — E785 Hyperlipidemia, unspecified: Secondary | ICD-10-CM | POA: Diagnosis not present

## 2017-01-21 DIAGNOSIS — M810 Age-related osteoporosis without current pathological fracture: Secondary | ICD-10-CM | POA: Diagnosis not present

## 2017-01-21 DIAGNOSIS — N951 Menopausal and female climacteric states: Secondary | ICD-10-CM | POA: Diagnosis not present

## 2017-01-21 DIAGNOSIS — G20 Parkinson's disease: Secondary | ICD-10-CM | POA: Diagnosis not present

## 2017-01-21 DIAGNOSIS — E042 Nontoxic multinodular goiter: Secondary | ICD-10-CM | POA: Diagnosis not present

## 2017-02-04 DIAGNOSIS — E785 Hyperlipidemia, unspecified: Secondary | ICD-10-CM | POA: Diagnosis not present

## 2017-02-04 DIAGNOSIS — R634 Abnormal weight loss: Secondary | ICD-10-CM | POA: Diagnosis not present

## 2017-02-04 DIAGNOSIS — M810 Age-related osteoporosis without current pathological fracture: Secondary | ICD-10-CM | POA: Diagnosis not present

## 2017-02-04 DIAGNOSIS — E559 Vitamin D deficiency, unspecified: Secondary | ICD-10-CM | POA: Diagnosis not present

## 2017-02-04 DIAGNOSIS — G20 Parkinson's disease: Secondary | ICD-10-CM | POA: Diagnosis not present

## 2017-05-08 ENCOUNTER — Emergency Department
Admission: EM | Admit: 2017-05-08 | Discharge: 2017-05-08 | Disposition: A | Discharge status: Home / Self Care | Payer: Medicare HMO | Attending: Emergency Medicine | Admitting: Emergency Medicine

## 2017-05-08 ENCOUNTER — Encounter: Payer: Self-pay | Admitting: Emergency Medicine

## 2017-05-08 ENCOUNTER — Emergency Department: Payer: Medicare HMO

## 2017-05-08 DIAGNOSIS — W010XXA Fall on same level from slipping, tripping and stumbling without subsequent striking against object, initial encounter: Secondary | ICD-10-CM | POA: Insufficient documentation

## 2017-05-08 DIAGNOSIS — Y92009 Unspecified place in unspecified non-institutional (private) residence as the place of occurrence of the external cause: Secondary | ICD-10-CM | POA: Insufficient documentation

## 2017-05-08 DIAGNOSIS — Y999 Unspecified external cause status: Secondary | ICD-10-CM | POA: Insufficient documentation

## 2017-05-08 DIAGNOSIS — W19XXXA Unspecified fall, initial encounter: Secondary | ICD-10-CM

## 2017-05-08 DIAGNOSIS — S41112A Laceration without foreign body of left upper arm, initial encounter: Secondary | ICD-10-CM | POA: Diagnosis not present

## 2017-05-08 DIAGNOSIS — Y939 Activity, unspecified: Secondary | ICD-10-CM | POA: Insufficient documentation

## 2017-05-08 DIAGNOSIS — Z23 Encounter for immunization: Secondary | ICD-10-CM | POA: Insufficient documentation

## 2017-05-08 DIAGNOSIS — G20 Parkinson's disease: Secondary | ICD-10-CM | POA: Diagnosis not present

## 2017-05-08 DIAGNOSIS — S4992XA Unspecified injury of left shoulder and upper arm, initial encounter: Secondary | ICD-10-CM | POA: Diagnosis present

## 2017-05-08 DIAGNOSIS — Z7982 Long term (current) use of aspirin: Secondary | ICD-10-CM | POA: Insufficient documentation

## 2017-05-08 DIAGNOSIS — Z79899 Other long term (current) drug therapy: Secondary | ICD-10-CM | POA: Insufficient documentation

## 2017-05-08 DIAGNOSIS — S51011A Laceration without foreign body of right elbow, initial encounter: Secondary | ICD-10-CM

## 2017-05-08 DIAGNOSIS — S40022A Contusion of left upper arm, initial encounter: Secondary | ICD-10-CM

## 2017-05-08 MED ORDER — TETANUS-DIPHTH-ACELL PERTUSSIS 5-2.5-18.5 LF-MCG/0.5 IM SUSP
0.5000 mL | Freq: Once | INTRAMUSCULAR | Status: AC
  Administered 2017-05-08: 0.5 mL via INTRAMUSCULAR
  Filled 2017-05-08: qty 0.5

## 2017-05-08 NOTE — ED Provider Notes (Signed)
[facility] Emergency Department Provider Note  ____________________________________________   None    (approximate)  I have reviewed the triage vital signs and the nursing notes.   HISTORY  Chief Complaint Fall    HPI MARCHELL FROMAN is a 72 y.o. female resents emergency department stating that she fell a couple of days ago.  She had the dog and the dog tripped her.  She states that the left arm has been hurting.  She does have some skin tears in the same area.  She denies any loss of consciousness.  She is not on any blood thinners.  She is unsure of her last tetanus  Past Medical History:  Diagnosis Date  . Arthritis   . Parkinson disease [facility])     Patient Active Problem List   Diagnosis Date Noted  . Parkinson's disease ([place]) 06/10/2012    Past Surgical History:  Procedure Laterality Date  . APPENDECTOMY    . ESOPHAGOGASTRODUODENOSCOPY (EGD) WITH PROPOFOL N/A 06/19/2015   Procedure: ESOPHAGOGASTRODUODENOSCOPY (EGD) WITH PROPOFOL;  Surgeon: Lollie Sails, MD;  Location: [facility] ENDOSCOPY;  Service: Endoscopy;  Laterality: N/A;    Prior to Admission medications   Medication Sig Start Date End Date Taking? Authorizing Provider  acetaminophen (TYLENOL) 650 MG CR tablet Take 1,300 mg by mouth 2 (two) times daily as needed for pain.    [provider]  amantadine (SYMMETREL) 100 MG capsule Take 100 mg by mouth 2 (two) times daily. 03/23/15   [provider]  aspirin 325 MG tablet Take 325 mg by mouth daily.    [provider]  carbidopa-levodopa (SINEMET) 25-100 MG tablet Take 1-2 tablets by mouth 4 (four) times daily. 03/02/15   [provider]  ciprofloxacin (CIPRO) 250 MG tablet  05/07/16   [provider]  lubiprostone (AMITIZA) 24 MCG capsule Take 24 mcg by mouth daily with breakfast.    [provider]  meloxicam (MOBIC) 7.5 MG tablet Take 7.5 mg by mouth daily.    [provider]    mirtazapine (REMERON) 7.5 MG tablet Take 7.5 mg by mouth at bedtime.    [provider]  polyethylene glycol-electrolytes (TRILYTE) 420 G solution Take 4,000 mLs by mouth once. Patient not taking: Reported on 04/27/2015 10/02/14   Earleen Newport, MD  pravastatin (PRAVACHOL) 40 MG tablet Take 40 mg by mouth at bedtime.    [provider]  Riboflavin (VITAMIN B-2) 50 MG TABS Take 50 mg by mouth daily.    [provider]  rOPINIRole (REQUIP) 0.5 MG tablet Take 0.5 mg by mouth daily. 03/02/15   [provider]  Vitamin D, Ergocalciferol, (DRISDOL) 50000 units CAPS capsule Take 50,000 Units by mouth every Wednesday. 04/18/15   [provider]    Allergies Patient has no known allergies.  No family history on file.  Social History Social History   Tobacco Use  . Smoking status: Never Smoker  . Smokeless tobacco: Never Used  Substance Use Topics  . Alcohol use: Yes    Comment: occ  . Drug use: No    Review of Systems  Constitutional: No fever/chills Eyes: No visual changes. ENT: No sore throat. Respiratory: Denies cough Genitourinary: Negative for dysuria. Musculoskeletal: Positive for back pain.  Positive for left arm pain Skin: Negative for rash.  Positive for skin tears    ____________________________________________   PHYSICAL EXAM:  VITAL SIGNS: ED Triage Vitals  Enc Vitals Group     BP 05/08/17 1651 (!)  157/79     Pulse Rate 05/08/17 1651 96     Resp 05/08/17 1651 15     Temp 05/08/17 1651 98.2 F (36.8 C)     Temp Source 05/08/17 1651 Oral     SpO2 05/08/17 1651 100 %     Weight 05/08/17 1652 85 lb (38.6 kg)     Height 05/08/17 1652 5\' 3"  (1.6 m)     Head Circumference --      Peak Flow --      Pain Score 05/08/17 1651 2     Pain Loc --      Pain Edu? --      Excl. in [place]? --     Constitutional: Alert and oriented. Well appearing and in no acute distress. Eyes: Conjunctivae are normal.  Head:  Atraumatic. Nose: No congestion/rhinnorhea. Mouth/Throat: Mucous membranes are moist.   Cardiovascular: Normal rate, regular rhythm. Respiratory: Normal respiratory effort.  No retractions GU: deferred Musculoskeletal: The left elbow is tender.  The pain is reproduced with range of motion.  The lumbar spine is a little tender.  Neurovascular is intact Neurologic:  Normal speech and language.  Skin:  Skin is warm, dry.  The left forearm has 3 skin tears Psychiatric: Mood and affect are normal. Speech and behavior are normal.  ____________________________________________   LABS (all labs ordered are listed, but only abnormal results are displayed)  Labs Reviewed - No data to display ____________________________________________   ____________________________________________  RADIOLOGY  X-ray of the left elbow is negative for any acute abnormality X-ray of the lumbar spine shows scoliosis and some subluxation but no acute fracture  ____________________________________________   PROCEDURES  Procedure(s) performed:   Marland KitchenMarland KitchenLaceration Repair Date/Time: 05/08/2017 7:28 PM Performed by: Versie Starks, PA-C Authorized by: Versie Starks, PA-C   Consent:    Consent obtained:  Verbal   Consent given by:  Patient   Risks discussed:  Infection, poor cosmetic result and poor wound healing   Alternatives discussed:  No treatment Anesthesia (see MAR for exact dosages):    Anesthesia method:  None Laceration details:    Location:  Shoulder/arm   Shoulder/arm location:  L lower arm   Length (cm):  3   Depth (mm):  1 Repair type:    Repair type:  Simple Treatment:    Area cleansed with:  Saline   Amount of cleaning:  Standard   Irrigation solution:  Sterile saline   Irrigation method:  Tap Skin repair:    Repair method:  Steri-Strips and tissue adhesive Approximation:    Approximation:  Close Post-procedure details:    Dressing:  Open (no dressing)   Patient tolerance of  procedure:  Tolerated well, no immediate complications      ____________________________________________   INITIAL IMPRESSION / ASSESSMENT AND PLAN / ED COURSE  Pertinent labs & imaging results that were available during my care of the patient were reviewed by me and considered in my medical decision making (see chart for details).  Patient is a 72 year old female presents emergency department after a fall 2 days ago.  She has skin tears on the left forearm and is complaining of left elbow and lower back pain.  There is no head injury or loss of consciousness.  She is not taking blood thinners  On physical exam the left elbow is tender to palpation, there is skin tears on the left forearm.  The lumbar spine is slightly tender to palpation  X-ray of the left elbow and lumbar  spine are negative for any acute abnormality  The largest skin tear on the left forearm was repaired with Steri-Strips and adhesive.  Patient tolerated procedure well  X-ray results were given to the patient and her husband.  They were given instructions on how to care for the Steri-Strips and the Dermabond.  They state they understand.  She was discharged in stable condition     As part of my medical decision making, I reviewed the following data within the [place] notes reviewed and incorporated, Radiograph reviewed x-ray left elbow and lumbar spine are negative for acute injuries, Notes from prior ED visits and [place] Controlled Substance Database  ____________________________________________   FINAL CLINICAL IMPRESSION(S) / ED DIAGNOSES  Final diagnoses:  Fall, initial encounter  Contusion of left upper extremity, initial encounter  Skin tear of right elbow without complication, initial encounter      NEW MEDICATIONS STARTED DURING THIS VISIT:  New Prescriptions   No medications on file     Note:  This document was prepared using Dragon voice recognition software and may  include unintentional dictation errors.    Versie Starks, PA-C 05/08/17 1931    Lisa Roca, MD 05/11/17 1020

## 2017-05-08 NOTE — Discharge Instructions (Addendum)
Follow-up with your regular doctor if there are any problems.  Return to emergency department if you see any signs of infection.

## 2017-05-08 NOTE — ED Triage Notes (Signed)
Pt comes into the ED via POV c/o fall where she tripped over her dog.  Patient landed on her left arm on some brick.  Patient in NAD at this time and has movement ain the arm.  Patient has skin tears present to the left arm as well and decided to have it looked at.

## 2017-05-08 NOTE — ED Notes (Signed)
Pt and husband report pt fell the day before yesterday. Pt presents with skin tears to her L forearm with minimal dried blood noted at this time. Pt's husband reports that he cleaned out the wound. Bruising noted to L forearm, pt denies pain with the exception of her forearm where the skin tears are located.

## 2017-05-16 ENCOUNTER — Emergency Department: Payer: Medicare HMO

## 2017-05-16 ENCOUNTER — Other Ambulatory Visit: Payer: Self-pay

## 2017-05-16 ENCOUNTER — Emergency Department
Admission: EM | Admit: 2017-05-16 | Discharge: 2017-05-16 | Disposition: A | Discharge status: Home / Self Care | Payer: Medicare HMO | Attending: Emergency Medicine | Admitting: Emergency Medicine

## 2017-05-16 DIAGNOSIS — F0390 Unspecified dementia without behavioral disturbance: Secondary | ICD-10-CM | POA: Insufficient documentation

## 2017-05-16 DIAGNOSIS — M6281 Muscle weakness (generalized): Secondary | ICD-10-CM | POA: Diagnosis not present

## 2017-05-16 DIAGNOSIS — R531 Weakness: Secondary | ICD-10-CM

## 2017-05-16 DIAGNOSIS — R55 Syncope and collapse: Secondary | ICD-10-CM | POA: Diagnosis present

## 2017-05-16 DIAGNOSIS — Z79899 Other long term (current) drug therapy: Secondary | ICD-10-CM | POA: Diagnosis not present

## 2017-05-16 DIAGNOSIS — Z7982 Long term (current) use of aspirin: Secondary | ICD-10-CM | POA: Insufficient documentation

## 2017-05-16 DIAGNOSIS — G20 Parkinson's disease: Secondary | ICD-10-CM | POA: Diagnosis not present

## 2017-05-16 LAB — HEPATIC FUNCTION PANEL
ALK PHOS: 50 U/L (ref 38–126)
AST: 22 U/L (ref 15–41)
Albumin: 3.9 g/dL (ref 3.5–5.0)
Total Bilirubin: 0.7 mg/dL (ref 0.3–1.2)
Total Protein: 6.7 g/dL (ref 6.5–8.1)

## 2017-05-16 LAB — BASIC METABOLIC PANEL
ANION GAP: 10 (ref 5–15)
BUN: 14 mg/dL (ref 6–20)
CALCIUM: 9 mg/dL (ref 8.9–10.3)
CO2: 27 mmol/L (ref 22–32)
Chloride: 103 mmol/L (ref 101–111)
Creatinine, Ser: 0.87 mg/dL (ref 0.44–1.00)
GLUCOSE: 125 mg/dL — AB (ref 65–99)
Potassium: 3.9 mmol/L (ref 3.5–5.1)
SODIUM: 140 mmol/L (ref 135–145)

## 2017-05-16 LAB — URINALYSIS, COMPLETE (UACMP) WITH MICROSCOPIC
BILIRUBIN URINE: NEGATIVE
Bacteria, UA: NONE SEEN
Glucose, UA: NEGATIVE mg/dL
HGB URINE DIPSTICK: NEGATIVE
Ketones, ur: NEGATIVE mg/dL
Leukocytes, UA: NEGATIVE
NITRITE: NEGATIVE
Protein, ur: NEGATIVE mg/dL
SPECIFIC GRAVITY, URINE: 1.01 (ref 1.005–1.030)
pH: 8 (ref 5.0–8.0)

## 2017-05-16 LAB — CBC
HCT: 38.8 % (ref 35.0–47.0)
HEMOGLOBIN: 13 g/dL (ref 12.0–16.0)
MCH: 31.7 pg (ref 26.0–34.0)
MCHC: 33.6 g/dL (ref 32.0–36.0)
MCV: 94.5 fL (ref 80.0–100.0)
Platelets: 234 10*3/uL (ref 150–440)
RBC: 4.11 MIL/uL (ref 3.80–5.20)
RDW: 13.3 % (ref 11.5–14.5)
WBC: 7.8 10*3/uL (ref 3.6–11.0)

## 2017-05-16 LAB — TROPONIN I

## 2017-05-16 MED ORDER — SODIUM CHLORIDE 0.9 % IV BOLUS
1000.0000 mL | Freq: Once | INTRAVENOUS | Status: AC
  Administered 2017-05-16: 1000 mL via INTRAVENOUS

## 2017-05-16 NOTE — Discharge Instructions (Addendum)
As we discussed please drink plenty fluids over the next several days.  Please follow-up with your doctor as soon as possible, call today to inform them of today's ER visit.  Return to the emergency department for any chest pain, weakness, difficulty walking, or any other symptom personally concerning to yourself.

## 2017-05-16 NOTE — ED Notes (Signed)
Pt ambulatory to toilet with assistance.  

## 2017-05-16 NOTE — ED Notes (Signed)
Pt in scans, will administer IVF when pt returns.

## 2017-05-16 NOTE — ED Notes (Signed)
Pt ambulatory to toilet

## 2017-05-16 NOTE — ED Provider Notes (Signed)
[facility] Emergency Department Provider Note  Time seen: 11:53 AM  I have reviewed the triage vital signs and the nursing notes.   HISTORY  Chief Complaint Weakness/somnolence   HPI Jillian Vaughn is a 72 y.o. female with a past medical history of arthritis, Parkinson's, presents to the emergency department for generalized fatigue, weakness/somnolence.  According to the husband they went out to lunch yesterday and the patient was acting fairly normal, however since lunch the patient has become more fatigued and somnolent.  Has become more weak, husband helped her to the couch this morning and she slumped over and fell asleep, he states this is very abnormal for her.  Normally she ambulates with a walker but is been very weak over the past 24 hours.  Husband denies any recent vomiting, diarrhea, no known fever.  Patient cannot provide history due to baseline dementia, cannot obtain an accurate/adequate review of systems either.   Past Medical History:  Diagnosis Date  . Arthritis   . Parkinson disease [facility])     Patient Active Problem List   Diagnosis Date Noted  . Parkinson's disease ([place]) 06/10/2012    Past Surgical History:  Procedure Laterality Date  . APPENDECTOMY    . ESOPHAGOGASTRODUODENOSCOPY (EGD) WITH PROPOFOL N/A 06/19/2015   Procedure: ESOPHAGOGASTRODUODENOSCOPY (EGD) WITH PROPOFOL;  Surgeon: Lollie Sails, MD;  Location: [facility] ENDOSCOPY;  Service: Endoscopy;  Laterality: N/A;    Prior to Admission medications   Medication Sig Start Date End Date Taking? Authorizing Provider  acetaminophen (TYLENOL) 650 MG CR tablet Take 1,300 mg by mouth 2 (two) times daily as needed for pain.    [provider]  amantadine (SYMMETREL) 100 MG capsule Take 100 mg by mouth 2 (two) times daily. 03/23/15   [provider]  aspirin 325 MG tablet Take 325 mg by mouth daily.    [provider]  carbidopa-levodopa (SINEMET) 25-100 MG tablet  Take 1-2 tablets by mouth 4 (four) times daily. 03/02/15   [provider]  ciprofloxacin (CIPRO) 250 MG tablet  05/07/16   [provider]  lubiprostone (AMITIZA) 24 MCG capsule Take 24 mcg by mouth daily with breakfast.    [provider]  meloxicam (MOBIC) 7.5 MG tablet Take 7.5 mg by mouth daily.    [provider]  mirtazapine (REMERON) 7.5 MG tablet Take 7.5 mg by mouth at bedtime.    [provider]  polyethylene glycol-electrolytes (TRILYTE) 420 G solution Take 4,000 mLs by mouth once. Patient not taking: Reported on 04/27/2015 10/02/14   Earleen Newport, MD  pravastatin (PRAVACHOL) 40 MG tablet Take 40 mg by mouth at bedtime.    [provider]  Riboflavin (VITAMIN B-2) 50 MG TABS Take 50 mg by mouth daily.    [provider]  rOPINIRole (REQUIP) 0.5 MG tablet Take 0.5 mg by mouth daily. 03/02/15   [provider]  Vitamin D, Ergocalciferol, (DRISDOL) 50000 units CAPS capsule Take 50,000 Units by mouth every Wednesday. 04/18/15   [provider]    No Known Allergies  History reviewed. No pertinent family history.  Social History Social History   Tobacco Use  . Smoking status: Never Smoker  . Smokeless tobacco: Never Used  Substance Use Topics  . Alcohol use: Yes    Comment: occ  . Drug use: No    Review of Systems Unable to obtain an adequate/accurate review of systems secondary to dementia  ____________________________________________   PHYSICAL EXAM:  VITAL SIGNS:  ED Triage Vitals  Enc Vitals Group     BP 05/16/17 1129 (!) 188/98     Pulse Rate 05/16/17 1129 78     Resp 05/16/17 1129 (!) 24     Temp 05/16/17 1129 (!) 97.4 F (36.3 C)     Temp Source 05/16/17 1129 Oral     SpO2 05/16/17 1129 99 %     Weight 05/16/17 1127 85 lb (38.6 kg)     Height 05/16/17 1127 5\' 3"  (1.6 m)     Head Circumference --      Peak Flow --      Pain Score 05/16/17 1127 0     Pain Loc --      Pain  Edu? --      Excl. in [place]? --     Constitutional: Patient is somnolent, falls asleep, awakens to voice, will briefly answer questions before falling back asleep. Eyes: Normal exam ENT   Head: Normocephalic and atraumatic.   Nose: No congestion/rhinnorhea.   Mouth/Throat: Dry mucous membranes Cardiovascular: Normal rate, regular rhythm.  Respiratory: Normal respiratory effort without tachypnea nor retractions. Breath sounds are clear  Gastrointestinal: Soft and nontender. No distention.   Musculoskeletal: Nontender with normal range of motion in all extremities. No lower extremity edema. Neurologic: Somnolent, will briefly answer questions before falling asleep, questionable accuracy given dementia.  Does appear to move all extremities. Skin:  Skin is warm, dry and intact.  Psychiatric: Somnolent.  ____________________________________________    EKG  EKG reviewed and interpreted by myself shows sinus rhythm at 75 bpm with a narrow QRS, normal axis, normal intervals, nonspecific but no concerning ST changes.  ____________________________________________    RADIOLOGY  Chest x-ray negative  CT head negative  ____________________________________________   INITIAL IMPRESSION / ASSESSMENT AND PLAN / ED COURSE  Pertinent labs & imaging results that were available during my care of the patient were reviewed by me and considered in my medical decision making (see chart for details).  Patient presents to the emergency department for weakness/somnolence.  Husband states this has been progressively worse over the past 20 hours or so.  Differential is quite broad but would include infectious etiologies such as urinary tract infection, pneumonia, left light/metabolic abnormality, CVA, ACS.  We will check labs including cardiac enzymes, urinalysis, urine culture, chest x-ray, CT head, IV hydrate and continue to closely monitor.  Patient has been agreeable to this plan of  care.  Patient's labs are resulted largely within normal limits.  CT imaging and x-ray are normal.  EKG is reassuring.  Urinalysis is normal.  After IV fluids patient is much more awake and interactive.  Husband is asking to be discharged home.  At this time I believe the patient is safe for discharge.  I discussed PCP follow-up as well as return precautions.  ____________________________________________   FINAL CLINICAL IMPRESSION(S) / ED DIAGNOSES  Weakness    Harvest Dark, MD 05/16/17 1444

## 2017-05-16 NOTE — ED Triage Notes (Signed)
Pt arrives to ED via [facility] from home. Pt was sitting on couch and per EMS had a syncopal episode. Pt has dementia at baseline. Husband told EMS that pt normally walks. Pt is alert upon arrival. EMS VS 176/104, HR 75. Arrives with 20 L FA.

## 2017-05-17 LAB — URINE CULTURE: Culture: NO GROWTH

## 2017-05-25 ENCOUNTER — Encounter: Payer: Self-pay | Admitting: Emergency Medicine

## 2017-05-25 ENCOUNTER — Emergency Department
Admission: EM | Admit: 2017-05-25 | Discharge: 2017-05-25 | Disposition: A | Discharge status: Home / Self Care | Payer: Medicare HMO | Attending: Emergency Medicine | Admitting: Emergency Medicine

## 2017-05-25 ENCOUNTER — Emergency Department: Payer: Medicare HMO

## 2017-05-25 ENCOUNTER — Other Ambulatory Visit: Payer: Self-pay

## 2017-05-25 DIAGNOSIS — F0390 Unspecified dementia without behavioral disturbance: Secondary | ICD-10-CM | POA: Diagnosis not present

## 2017-05-25 DIAGNOSIS — Z7902 Long term (current) use of antithrombotics/antiplatelets: Secondary | ICD-10-CM | POA: Insufficient documentation

## 2017-05-25 DIAGNOSIS — Z79899 Other long term (current) drug therapy: Secondary | ICD-10-CM | POA: Diagnosis not present

## 2017-05-25 DIAGNOSIS — Z7982 Long term (current) use of aspirin: Secondary | ICD-10-CM | POA: Diagnosis not present

## 2017-05-25 DIAGNOSIS — R400 Somnolence: Secondary | ICD-10-CM | POA: Insufficient documentation

## 2017-05-25 DIAGNOSIS — G20 Parkinson's disease: Secondary | ICD-10-CM | POA: Insufficient documentation

## 2017-05-25 DIAGNOSIS — R4182 Altered mental status, unspecified: Secondary | ICD-10-CM | POA: Diagnosis present

## 2017-05-25 DIAGNOSIS — E860 Dehydration: Secondary | ICD-10-CM

## 2017-05-25 HISTORY — DX: Unspecified dementia, unspecified severity, without behavioral disturbance, psychotic disturbance, mood disturbance, and anxiety: F03.90

## 2017-05-25 LAB — COMPREHENSIVE METABOLIC PANEL
ALK PHOS: 49 U/L (ref 38–126)
AST: 23 U/L (ref 15–41)
Albumin: 3.7 g/dL (ref 3.5–5.0)
Anion gap: 5 (ref 5–15)
BUN: 20 mg/dL (ref 6–20)
CALCIUM: 8.7 mg/dL — AB (ref 8.9–10.3)
CHLORIDE: 107 mmol/L (ref 101–111)
CO2: 28 mmol/L (ref 22–32)
CREATININE: 0.92 mg/dL (ref 0.44–1.00)
GFR calc non Af Amer: >60 mL/min (ref >60)
Glucose, Bld: 118 mg/dL — ABNORMAL HIGH (ref 65–99)
Potassium: 4.1 mmol/L (ref 3.5–5.1)
SODIUM: 140 mmol/L (ref 135–145)
Total Bilirubin: 0.9 mg/dL (ref 0.3–1.2)
Total Protein: 6.2 g/dL — ABNORMAL LOW (ref 6.5–8.1)

## 2017-05-25 LAB — URINALYSIS, COMPLETE (UACMP) WITH MICROSCOPIC
BACTERIA UA: NONE SEEN
BILIRUBIN URINE: NEGATIVE
Glucose, UA: NEGATIVE mg/dL
Hgb urine dipstick: NEGATIVE
KETONES UR: 5 mg/dL — AB
LEUKOCYTES UA: NEGATIVE
NITRITE: NEGATIVE
PROTEIN: NEGATIVE mg/dL
SPECIFIC GRAVITY, URINE: 1.013 (ref 1.005–1.030)
Squamous Epithelial / LPF: NONE SEEN
pH: 6 (ref 5.0–8.0)

## 2017-05-25 LAB — CBC
HEMATOCRIT: 34.8 % — AB (ref 35.0–47.0)
HEMOGLOBIN: 11.8 g/dL — AB (ref 12.0–16.0)
MCH: 32 pg (ref 26.0–34.0)
MCHC: 34 g/dL (ref 32.0–36.0)
MCV: 94.4 fL (ref 80.0–100.0)
Platelets: 289 10*3/uL (ref 150–440)
RBC: 3.68 MIL/uL — AB (ref 3.80–5.20)
RDW: 13.3 % (ref 11.5–14.5)
WBC: 6.4 10*3/uL (ref 3.6–11.0)

## 2017-05-25 LAB — TROPONIN I

## 2017-05-25 MED ORDER — SODIUM CHLORIDE 0.9 % IV BOLUS
1000.0000 mL | Freq: Once | INTRAVENOUS | Status: AC
  Administered 2017-05-25: 1000 mL via INTRAVENOUS

## 2017-05-25 NOTE — ED Provider Notes (Addendum)
[facility] Emergency Department Provider Note  ____________________________________________   I have reviewed the triage vital signs and the nursing notes. Where available I have reviewed prior notes and, if possible and indicated, outside hospital notes.    HISTORY  Chief Complaint Altered Mental Status    HPI Jillian Vaughn is a 72 y.o. female with a history of Parkinson's on multiple medications which her husband gives to her, she presents with her husband because she was more sleepy than normal this morning.  She was not "unconscious or unresponsive".  The patient was able to ambulate to bed.  She has episodes on a frequent basis where she is more somnolent than normal and is possibly thought to be because of her medications.  In any event, she is been seen here 2 or 3 times in the past for similar, usually gets better with IV fluids.  Patient herself has no complaints, she does suffer from dementia, she is at her baseline neurologic status.  She has had no nausea vomiting no diarrhea no chest pain or shortness of breath no headache no falls no trauma, the patient has had no recent change in patient's own as far as that has beenbeen no accidental they have been changing some of her meds recently apparently.  The patient has no complaints of pain did not fall.     Past Medical History:  Diagnosis Date  . Arthritis   . Dementia   . Parkinson disease [facility])     Patient Active Problem List   Diagnosis Date Noted  . Parkinson's disease ([place]) 06/10/2012    Past Surgical History:  Procedure Laterality Date  . APPENDECTOMY    . ESOPHAGOGASTRODUODENOSCOPY (EGD) WITH PROPOFOL N/A 06/19/2015   Procedure: ESOPHAGOGASTRODUODENOSCOPY (EGD) WITH PROPOFOL;  Surgeon: Lollie Sails, MD;  Location: [facility] ENDOSCOPY;  Service: Endoscopy;  Laterality: N/A;    Prior to Admission medications   Medication Sig Start Date End Date Taking? Authorizing Provider  acetaminophen  (TYLENOL) 650 MG CR tablet Take 1,300 mg by mouth 2 (two) times daily as needed for pain.    [provider]  amantadine (SYMMETREL) 100 MG capsule Take 100 mg by mouth 2 (two) times daily. 03/23/15   [provider]  aspirin 325 MG tablet Take 325 mg by mouth daily.    [provider]  carbidopa-levodopa (SINEMET) 25-100 MG tablet Take 2 tablets by mouth 4 (four) times daily.  03/02/15   [provider]  lubiprostone (AMITIZA) 24 MCG capsule Take 24 mcg by mouth daily with breakfast.    [provider]  meloxicam (MOBIC) 7.5 MG tablet Take 7.5 mg by mouth daily.    [provider]  mirtazapine (REMERON) 7.5 MG tablet Take 7.5 mg by mouth at bedtime.    [provider]  pravastatin (PRAVACHOL) 40 MG tablet Take 40 mg by mouth at bedtime.    [provider]  Riboflavin (VITAMIN B-2) 50 MG TABS Take 50 mg by mouth daily.    [provider]  rOPINIRole (REQUIP) 0.5 MG tablet Take 0.5 mg by mouth daily.    [provider]  Vitamin D, Ergocalciferol, (DRISDOL) 50000 units CAPS capsule Take 50,000 Units by mouth every Wednesday. 04/18/15   [provider]    Allergies Patient has no known allergies.  No family history on file.  Social History Social History   Tobacco Use  . Smoking status: Never Smoker  . Smokeless tobacco: Never Used  Substance Use Topics  .  Alcohol use: Yes    Comment: occ  . Drug use: No    Review of Systems Constitutional: No fever/chills Eyes: No visual changes. ENT: No sore throat. No stiff neck no neck pain Cardiovascular: Denies chest pain. Respiratory: Denies shortness of breath. Gastrointestinal:   no vomiting.  No diarrhea.  No constipation. Genitourinary: Negative for dysuria. Musculoskeletal: Negative lower extremity swelling Skin: Negative for rash. Neurological: Negative for severe headaches, focal weakness or numbness.  Level 5 chart caveat; no further  history available due to patient status.  ____________________________________________   PHYSICAL EXAM:  VITAL SIGNS: ED Triage Vitals  Enc Vitals Group     BP 05/25/17 1055 133/72     Pulse Rate 05/25/17 1055 78     Resp 05/25/17 1055 14     Temp 05/25/17 1055 97.9 F (36.6 C)     Temp Source 05/25/17 1055 Oral     SpO2 05/25/17 1048 98 %     Weight 05/25/17 1051 85 lb (38.6 kg)     Height 05/25/17 1051 5\' 3"  (1.6 m)     Head Circumference --      Peak Flow --      Pain Score 05/25/17 1055 0     Pain Loc --      Pain Edu? --      Excl. in [place]? --     Constitutional: Patient is sleeping when into the room and I talked her she wakes up and talks to me appropriately at her baseline, Eyes: Conjunctivae are normal pupils are normal equally reactive to light there is no mydriasis Head: Atraumatic HEENT: No congestion/rhinnorhea. Mucous membranes are moist.  Oropharynx non-erythematous Neck:   Nontender with no meningismus, no masses, no stridor Cardiovascular: Normal rate, regular rhythm. Grossly normal heart sounds.  Good peripheral circulation. Respiratory: Normal respiratory effort.  No retractions. Lungs CTAB. Abdominal: Soft and nontender. No distention. No guarding no rebound Back:  There is no focal tenderness or step off.  there is no midline tenderness there are no lesions noted. there is no CVA tenderness Musculoskeletal: No lower extremity tenderness, no upper extremity tenderness. No joint effusions, no DVT signs strong distal pulses no edema Neurologic:  Normal speech and language. No gross focal neurologic deficits are appreciated.  Skin:  Skin is warm, dry and intact. No rash noted. Psychiatric: Mood and affect are normal. Speech and behavior are normal.  ____________________________________________   LABS (all labs ordered are listed, but only abnormal results are displayed)  Labs Reviewed  COMPREHENSIVE METABOLIC PANEL - Abnormal; Notable for the following  components:      Result Value   Glucose, Bld 118 (*)    Calcium 8.7 (*)    Total Protein 6.2 (*)    ALT <5 (*)    All other components within normal limits  CBC - Abnormal; Notable for the following components:   RBC 3.68 (*)    Hemoglobin 11.8 (*)    HCT 34.8 (*)    All other components within normal limits  URINALYSIS, COMPLETE (UACMP) WITH MICROSCOPIC - Abnormal; Notable for the following components:   Color, Urine YELLOW (*)    APPearance CLEAR (*)    Ketones, ur 5 (*)    All other components within normal limits  TROPONIN I  CBG MONITORING, ED    Pertinent labs  results that were available during my care of the patient were reviewed by me and considered in my medical decision making (see chart for details). ____________________________________________  EKG  I personally interpreted any EKGs ordered by me or triage Sinus rhythm rate 70 bpm no acute ST elevation or depression, normal axis, LVH noted. ____________________________________________  RADIOLOGY  Pertinent labs & imaging results that were available during my care of the patient were reviewed by me and considered in my medical decision making (see chart for details). If possible, patient and/or family made aware of any abnormal findings.  Ct Head Wo Contrast  Result Date: 05/25/2017 CLINICAL DATA:  72 year old female with altered mental status. Known history of Parkinson's disease and Parkinson's dementia EXAM: CT HEAD WITHOUT CONTRAST TECHNIQUE: Contiguous axial images were obtained from the base of the skull through the vertex without intravenous contrast. COMPARISON:  Prior head CT 05/16/2017 FINDINGS: Brain: No evidence of acute infarction, hemorrhage, hydrocephalus, extra-axial collection or mass lesion/mass effect. Vascular: No hyperdense vessel or unexpected calcification. Skull: Normal. Negative for fracture or focal lesion. Sinuses/Orbits: No acute finding. Other: None. IMPRESSION: Negative head CT.  Electronically Signed   By: Jacqulynn Cadet M.D.   On: 05/25/2017 12:11   Dg Chest Port 1 View  Result Date: 05/25/2017 CLINICAL DATA:  Pt to ED via [facility] from home, per EMS report, pt has hx/o dementia and Parkinson disease. Pt was found to be more lethargic than normal this morning according to report from pt husbandNonsmoker EXAM: PORTABLE CHEST 1 VIEW COMPARISON:  05/16/2017 FINDINGS: Cardiac silhouette is normal in size. No mediastinal or hilar masses. No convincing adenopathy. Lungs are clear.  No pleural effusion or pneumothorax. Skeletal structures are grossly intact. IMPRESSION: No active disease. Electronically Signed   By: Lajean Manes M.D.   On: 05/25/2017 12:40   ____________________________________________    PROCEDURES  Procedure(s) performed: None  Procedures  Critical Care performed: None  ____________________________________________   INITIAL IMPRESSION / ASSESSMENT AND PLAN / ED COURSE  Pertinent labs & imaging results that were available during my care of the patient were reviewed by me and considered in my medical decision making (see chart for details).  Personally reviewed x-ray  Patient here with increased somnolence.  This happens on a regular basis.  Most the time it just passes but sometimes it seems to last longer than what does he brings him to the emergency room.  Multiple extensive workups in the emergency room not betrayed any significant pathology responsible for the patient's occasional increased somnolence, it is possible that this is a medication reaction.  The patient does not have any recollected trauma and was able to ambulate during her spell today but we did do a CT scan given our limitations in history and physical on this patient who is suffering from dementia and does not give a good history CT scan is reassuring, blood work is reassuring vital signs are reassuring, urine is reassuring, usually, when patient is given IV fluid she wakes up  returns to baseline goes, we will see if that happens today.  ----------------------------------------- 1:50 PM on 05/25/2017 -----------------------------------------  Feels that she is back to her baseline after fluid she is awake, she is talking to me she is looking about,     ____________________________________________   FINAL CLINICAL IMPRESSION(S) / ED DIAGNOSES  Final diagnoses:  Somnolence      This chart was dictated using voice recognition software.  Despite best efforts to proofread,  errors can occur which can change meaning.      Schuyler Amor, MD 05/25/17 1300    Schuyler Amor, MD 05/25/17 1350

## 2017-05-25 NOTE — ED Triage Notes (Signed)
Pt to ED via [facility] from home, per EMS report, pt has hx/o dementia and Parkinson disease. Pt was found to be more lethargic than normal this morning according to report from pt husband. VSS with EMS, pt does not appear to be in any distress at this time

## 2017-05-25 NOTE — ED Notes (Signed)
Pt husband at bedside, states that pt was "unresponsive for 45 minutes" and "her eyes were dilated". Pt husband also reports that pt was cool and clammy.

## 2017-06-19 ENCOUNTER — Emergency Department: Payer: Medicare HMO

## 2017-06-19 ENCOUNTER — Emergency Department
Admission: EM | Admit: 2017-06-19 | Discharge: 2017-06-19 | Disposition: A | Discharge status: Home / Self Care | Payer: Medicare HMO | Attending: Emergency Medicine | Admitting: Emergency Medicine

## 2017-06-19 ENCOUNTER — Encounter: Payer: Self-pay | Admitting: Emergency Medicine

## 2017-06-19 ENCOUNTER — Other Ambulatory Visit: Payer: Self-pay

## 2017-06-19 DIAGNOSIS — F0390 Unspecified dementia without behavioral disturbance: Secondary | ICD-10-CM | POA: Diagnosis not present

## 2017-06-19 DIAGNOSIS — G20 Parkinson's disease: Secondary | ICD-10-CM | POA: Insufficient documentation

## 2017-06-19 DIAGNOSIS — R531 Weakness: Secondary | ICD-10-CM | POA: Insufficient documentation

## 2017-06-19 DIAGNOSIS — R4182 Altered mental status, unspecified: Secondary | ICD-10-CM | POA: Diagnosis present

## 2017-06-19 DIAGNOSIS — Z7982 Long term (current) use of aspirin: Secondary | ICD-10-CM | POA: Insufficient documentation

## 2017-06-19 LAB — URINALYSIS, COMPLETE (UACMP) WITH MICROSCOPIC
BACTERIA UA: NONE SEEN
BILIRUBIN URINE: NEGATIVE
Glucose, UA: NEGATIVE mg/dL
Hgb urine dipstick: NEGATIVE
Ketones, ur: 5 mg/dL — AB
Leukocytes, UA: NEGATIVE
NITRITE: NEGATIVE
PH: 7 (ref 5.0–8.0)
Protein, ur: NEGATIVE mg/dL
SPECIFIC GRAVITY, URINE: 1.01 (ref 1.005–1.030)
SQUAMOUS EPITHELIAL / LPF: NONE SEEN (ref 0–5)

## 2017-06-19 LAB — CBC
HCT: 35.6 % (ref 35.0–47.0)
Hemoglobin: 12.1 g/dL (ref 12.0–16.0)
MCH: 32.4 pg (ref 26.0–34.0)
MCHC: 33.8 g/dL (ref 32.0–36.0)
MCV: 95.7 fL (ref 80.0–100.0)
PLATELETS: 230 10*3/uL (ref 150–440)
RBC: 3.72 MIL/uL — AB (ref 3.80–5.20)
RDW: 13.6 % (ref 11.5–14.5)
WBC: 4.5 10*3/uL (ref 3.6–11.0)

## 2017-06-19 LAB — COMPREHENSIVE METABOLIC PANEL
ALK PHOS: 49 U/L (ref 38–126)
AST: 23 U/L (ref 15–41)
Albumin: 3.9 g/dL (ref 3.5–5.0)
Anion gap: 7 (ref 5–15)
BUN: 23 mg/dL — AB (ref 6–20)
CALCIUM: 8.9 mg/dL (ref 8.9–10.3)
CHLORIDE: 107 mmol/L (ref 101–111)
CO2: 27 mmol/L (ref 22–32)
CREATININE: 0.85 mg/dL (ref 0.44–1.00)
GFR calc non Af Amer: >60 mL/min (ref >60)
Glucose, Bld: 99 mg/dL (ref 65–99)
Potassium: 3.6 mmol/L (ref 3.5–5.1)
SODIUM: 141 mmol/L (ref 135–145)
Total Bilirubin: 0.7 mg/dL (ref 0.3–1.2)
Total Protein: 6.2 g/dL — ABNORMAL LOW (ref 6.5–8.1)

## 2017-06-19 LAB — TROPONIN I: Troponin I: <0.03 ng/mL (ref <0.03)

## 2017-06-19 LAB — GLUCOSE, CAPILLARY: GLUCOSE-CAPILLARY: 97 mg/dL (ref 65–99)

## 2017-06-19 MED ORDER — SODIUM CHLORIDE 0.9 % IV BOLUS
500.0000 mL | Freq: Once | INTRAVENOUS | Status: AC
  Administered 2017-06-19: 500 mL via INTRAVENOUS

## 2017-06-19 NOTE — ED Notes (Signed)
Helped pt to the bathroom. She was steady on her feet. Husband at bedside.

## 2017-06-19 NOTE — ED Notes (Signed)
Pt taken to POV in a WC. Pt was able to get out of wheelchair and into POV without difficulty. VSS. NAD. Discharge instructions and follow up discussed. All questions answered.

## 2017-06-19 NOTE — Discharge Instructions (Signed)
Return to the ER for new, worsening, recurrent weakness, confusion, fevers, or any other new or worsening symptoms that concern you.  Follow-up with your regular doctor.

## 2017-06-19 NOTE — ED Provider Notes (Signed)
[facility] Emergency Department Provider Note ____________________________________________   First MD Initiated Contact with Patient 06/19/17 [phone]     (approximate)  I have reviewed the triage vital signs and the nursing notes.   HISTORY  Chief Complaint Altered Mental Status  Level 5 caveat: History of present illness limited due to altered mental status and dementia  HPI Jillian Vaughn is a 72 y.o. female with history of Parkinson's disease and other PMH as noted below who presents with altered mental status, acute onset this morning, and associated with decreased responsiveness and "dilated eyes" per her husband.  She has had multiple similar prior episodes where she is appeared more somnolent than normal, thought likely to be related to her medications.  The patient denies any acute complaints at this time.  Per husband, she was in her usual state of health until this morning.   Past Medical History:  Diagnosis Date  . Arthritis   . Dementia   . Parkinson disease [facility])     Patient Active Problem List   Diagnosis Date Noted  . Parkinson's disease ([place]) 06/10/2012    Past Surgical History:  Procedure Laterality Date  . APPENDECTOMY    . ESOPHAGOGASTRODUODENOSCOPY (EGD) WITH PROPOFOL N/A 06/19/2015   Procedure: ESOPHAGOGASTRODUODENOSCOPY (EGD) WITH PROPOFOL;  Surgeon: Lollie Sails, MD;  Location: [facility] ENDOSCOPY;  Service: Endoscopy;  Laterality: N/A;    Prior to Admission medications   Medication Sig Start Date End Date Taking? Authorizing Provider  amantadine (SYMMETREL) 100 MG capsule Take 100 mg by mouth 2 (two) times daily. 03/23/15  Yes [provider]  aspirin 325 MG tablet Take 325 mg by mouth daily.   Yes [provider]  carbidopa-levodopa (SINEMET) 25-100 MG tablet Take 2 tablets by mouth 4 (four) times daily.  03/02/15  Yes [provider]  lubiprostone (AMITIZA) 24 MCG capsule Take 24 mcg by mouth daily with  breakfast.   Yes [provider]  meloxicam (MOBIC) 7.5 MG tablet Take 7.5 mg by mouth daily.   Yes [provider]  mirtazapine (REMERON) 7.5 MG tablet Take 7.5 mg by mouth at bedtime.   Yes [provider]  pravastatin (PRAVACHOL) 40 MG tablet Take 40 mg by mouth at bedtime.   Yes [provider]  Riboflavin (VITAMIN B-2) 50 MG TABS Take 50 mg by mouth daily.   Yes [provider]  rOPINIRole (REQUIP) 0.5 MG tablet Take 0.5 mg by mouth daily.   Yes [provider]  Vitamin D, Ergocalciferol, (DRISDOL) 50000 units CAPS capsule Take 50,000 Units by mouth every Wednesday. 04/18/15  Yes [provider]  acetaminophen (TYLENOL) 650 MG CR tablet Take 1,300 mg by mouth 2 (two) times daily as needed for pain.    [provider]    Allergies Patient has no known allergies.  History reviewed. No pertinent family history.  Social History Social History   Tobacco Use  . Smoking status: Never Smoker  . Smokeless tobacco: Never Used  Substance Use Topics  . Alcohol use: Yes    Comment: occ  . Drug use: No    Review of Systems Level 5 caveat: Unable to obtain complete review of systems due to dementia and altered mental status    ____________________________________________   PHYSICAL EXAM:  VITAL SIGNS: ED Triage Vitals  Enc Vitals Group     BP 06/19/17 0918 (!) 158/95     Pulse Rate 06/19/17 0918 76     Resp 06/19/17 0918  14     Temp 06/19/17 0918 (!) 97.5 F (36.4 C)     Temp Source 06/19/17 0918 Oral     SpO2 06/19/17 0918 99 %     Weight --      Height --      Head Circumference --      Peak Flow --      Pain Score 06/19/17 0920 0     Pain Loc --      Pain Edu? --      Excl. in [place]? --     Constitutional: Alert, able to say her name and answer some basic questions. No acute distress. Eyes: Conjunctivae are normal.  EOMI.  PERRLA. Head: Atraumatic. Nose: No congestion/rhinnorhea. Mouth/Throat:  Mucous membranes are somewhat dry.   Neck: Normal range of motion.  Cardiovascular: Normal rate, regular rhythm. Grossly normal heart sounds.  Good peripheral circulation. Respiratory: Normal respiratory effort.  No retractions. Lungs CTAB. Gastrointestinal: Soft and nontender. No distention.  Genitourinary: No flank tenderness. Musculoskeletal: No lower extremity edema.  Extremities warm and well perfused.  Neurologic: Motor intact in all extremities.  Slightly dysarthric speech. Skin:  Skin is warm and dry. No rash noted. Psychiatric: Calm and cooperative.  ____________________________________________   LABS (all labs ordered are listed, but only abnormal results are displayed)  Labs Reviewed  COMPREHENSIVE METABOLIC PANEL - Abnormal; Notable for the following components:      Result Value   BUN 23 (*)    Total Protein 6.2 (*)    ALT <5 (*)    All other components within normal limits  CBC - Abnormal; Notable for the following components:   RBC 3.72 (*)    All other components within normal limits  URINALYSIS, COMPLETE (UACMP) WITH MICROSCOPIC - Abnormal; Notable for the following components:   Color, Urine STRAW (*)    APPearance CLEAR (*)    Ketones, ur 5 (*)    All other components within normal limits  GLUCOSE, CAPILLARY  TROPONIN I  CBG MONITORING, ED   ____________________________________________  EKG  ED ECG REPORT I, Arta Silence, the attending physician, personally viewed and interpreted this ECG.  Date: 06/19/2017 EKG Time: 914 Rate: 75 Rhythm: normal sinus rhythm QRS Axis: normal Intervals: RSR' ST/T Wave abnormalities: normal Narrative Interpretation: no evidence of acute ischemia  ____________________________________________  RADIOLOGY  CT head: No ICH or other acute findings CXR: No focal infiltrate  ____________________________________________   PROCEDURES  Procedure(s) performed: No  Procedures  Critical Care performed:  No ____________________________________________   INITIAL IMPRESSION / ASSESSMENT AND PLAN / ED COURSE  Pertinent labs & imaging results that were available during my care of the patient were reviewed by me and considered in my medical decision making (see chart for details).  72 year old female with history of Parkinson's and other PMH as noted above presents with AMS and increased generalized weakness since this morning.  I reviewed the past medical records in epic; patient has had multiple prior ED visits with similar presentation, once earlier this month, once last month, and in 2017.  On exam, the vital signs are normal except for hypertension.  Patient is somewhat weak appearing and lying with her mouth open, but is able to answer a few basic questions.  Neuro exam is nonfocal.  There is no evidence of trauma.  She appears possibly mildly dehydrated.  Overall presentation is consistent with medication side effects, Parkinson symptoms, dehydration or other metabolic cause, or less likely infection.  Presentation overall seems to  be similar to prior recent visits.  Plan: CT head, infection work-up, basic labs, IV hydration, and reassess.    ----------------------------------------- 2:16 PM on 06/19/2017 -----------------------------------------  Lab work-up is unremarkable.  CT head shows no acute findings.  The patient is returned to her baseline mental status.  Her husband feels comfortable taking her home, and the patient states she feels well.  Return precautions given, and they expressed understanding.  ____________________________________________   FINAL CLINICAL IMPRESSION(S) / ED DIAGNOSES  Final diagnoses:  Altered mental status, unspecified altered mental status type      NEW MEDICATIONS STARTED DURING THIS VISIT:  New Prescriptions   No medications on file     Note:  This document was prepared using Dragon voice recognition software and may include  unintentional dictation errors.    Arta Silence, MD 06/19/17 [phone]

## 2017-06-21 ENCOUNTER — Encounter: Payer: Self-pay | Admitting: Emergency Medicine

## 2017-06-21 ENCOUNTER — Other Ambulatory Visit: Payer: Self-pay

## 2017-06-21 ENCOUNTER — Emergency Department
Admission: EM | Admit: 2017-06-21 | Discharge: 2017-06-21 | Disposition: A | Discharge status: Home / Self Care | Payer: Medicare HMO | Attending: Emergency Medicine | Admitting: Emergency Medicine

## 2017-06-21 DIAGNOSIS — G20 Parkinson's disease: Secondary | ICD-10-CM | POA: Insufficient documentation

## 2017-06-21 DIAGNOSIS — F0280 Dementia in other diseases classified elsewhere without behavioral disturbance: Secondary | ICD-10-CM | POA: Insufficient documentation

## 2017-06-21 DIAGNOSIS — R4182 Altered mental status, unspecified: Secondary | ICD-10-CM | POA: Diagnosis present

## 2017-06-21 DIAGNOSIS — R404 Transient alteration of awareness: Secondary | ICD-10-CM | POA: Diagnosis not present

## 2017-06-21 DIAGNOSIS — Z79899 Other long term (current) drug therapy: Secondary | ICD-10-CM | POA: Insufficient documentation

## 2017-06-21 DIAGNOSIS — Z7982 Long term (current) use of aspirin: Secondary | ICD-10-CM | POA: Insufficient documentation

## 2017-06-21 LAB — CBC
HCT: 39.2 % (ref 35.0–47.0)
Hemoglobin: 13.2 g/dL (ref 12.0–16.0)
MCH: 32 pg (ref 26.0–34.0)
MCHC: 33.7 g/dL (ref 32.0–36.0)
MCV: 95 fL (ref 80.0–100.0)
PLATELETS: 273 10*3/uL (ref 150–440)
RBC: 4.13 MIL/uL (ref 3.80–5.20)
RDW: 13.8 % (ref 11.5–14.5)
WBC: 4.7 10*3/uL (ref 3.6–11.0)

## 2017-06-21 LAB — COMPREHENSIVE METABOLIC PANEL
ALK PHOS: 50 U/L (ref 38–126)
AST: 22 U/L (ref 15–41)
Albumin: 4.2 g/dL (ref 3.5–5.0)
Anion gap: 7 (ref 5–15)
BUN: 17 mg/dL (ref 6–20)
CALCIUM: 9.3 mg/dL (ref 8.9–10.3)
CHLORIDE: 105 mmol/L (ref 101–111)
CO2: 28 mmol/L (ref 22–32)
CREATININE: 0.87 mg/dL (ref 0.44–1.00)
GFR calc Af Amer: >60 mL/min (ref >60)
GFR calc non Af Amer: >60 mL/min (ref >60)
Glucose, Bld: 98 mg/dL (ref 65–99)
Potassium: 3.9 mmol/L (ref 3.5–5.1)
Sodium: 140 mmol/L (ref 135–145)
Total Bilirubin: 1.2 mg/dL (ref 0.3–1.2)
Total Protein: 6.9 g/dL (ref 6.5–8.1)

## 2017-06-21 NOTE — ED Provider Notes (Signed)
[facility] Emergency Department Provider Note   ____________________________________________    I have reviewed the triage vital signs and the nursing notes.   HISTORY  Chief Complaint Altered Mental Status     HPI Jillian Vaughn is a 72 y.o. female with a history of dementia and Parkinson's disease who presents today after an episode of altered mental status.  Husband reports the patient was not immediately responsive this morning and he felt that her pupils were dilated.  This is happened several times in the past, most recently 2 days ago but also more than a year ago.  The patient appears to be at her baseline now and the husband agrees with this.  No reports of fevers.   Past Medical History:  Diagnosis Date  . Arthritis   . Dementia   . Parkinson disease [facility])     Patient Active Problem List   Diagnosis Date Noted  . Parkinson's disease ([place]) 06/10/2012    Past Surgical History:  Procedure Laterality Date  . APPENDECTOMY    . ESOPHAGOGASTRODUODENOSCOPY (EGD) WITH PROPOFOL N/A 06/19/2015   Procedure: ESOPHAGOGASTRODUODENOSCOPY (EGD) WITH PROPOFOL;  Surgeon: Lollie Sails, MD;  Location: [facility] ENDOSCOPY;  Service: Endoscopy;  Laterality: N/A;    Prior to Admission medications   Medication Sig Start Date End Date Taking? Authorizing Provider  acetaminophen (TYLENOL) 650 MG CR tablet Take 1,300 mg by mouth 2 (two) times daily as needed for pain.    [provider]  amantadine (SYMMETREL) 100 MG capsule Take 100 mg by mouth 2 (two) times daily. 03/23/15   [provider]  aspirin 325 MG tablet Take 325 mg by mouth daily.    [provider]  carbidopa-levodopa (SINEMET) 25-100 MG tablet Take 2 tablets by mouth 4 (four) times daily.  03/02/15   [provider]  lubiprostone (AMITIZA) 24 MCG capsule Take 24 mcg by mouth daily with breakfast.    [provider]  meloxicam (MOBIC) 7.5 MG tablet Take 7.5  mg by mouth daily.    [provider]  mirtazapine (REMERON) 7.5 MG tablet Take 7.5 mg by mouth at bedtime.    [provider]  pravastatin (PRAVACHOL) 40 MG tablet Take 40 mg by mouth at bedtime.    [provider]  Riboflavin (VITAMIN B-2) 50 MG TABS Take 50 mg by mouth daily.    [provider]  rOPINIRole (REQUIP) 0.5 MG tablet Take 0.5 mg by mouth daily.    [provider]  Vitamin D, Ergocalciferol, (DRISDOL) 50000 units CAPS capsule Take 50,000 Units by mouth every Wednesday. 04/18/15   [provider]     Allergies Patient has no known allergies.  No family history on file.  Social History Social History   Tobacco Use  . Smoking status: Never Smoker  . Smokeless tobacco: Never Used  Substance Use Topics  . Alcohol use: Yes    Comment: occ  . Drug use: No    Review of Systems per husband  Constitutional: No reports of fever Eyes: No discharge ENT: No neck pain Cardiovascular: No reports of chest pain Respiratory: No difficult to breathing Gastrointestinal: No reports of vomiting or diarrhea Genitourinary: No foul-smelling urine Musculoskeletal: Negative for joint swelling Skin: Negative for rash. Neurological: Negative for new weakness   ____________________________________________   PHYSICAL EXAM:  VITAL SIGNS: ED Triage Vitals  Enc Vitals Group     BP 06/21/17 1022 (!) 149/81     Pulse Rate 06/21/17  1022 80     Resp 06/21/17 1022 18     Temp 06/21/17 1022 97.7 F (36.5 C)     Temp Source 06/21/17 1022 Oral     SpO2 06/21/17 1022 98 %     Weight 06/21/17 1023 38.6 kg (85 lb)     Height 06/21/17 1023 1.6 m (5\' 3" )     Head Circumference --      Peak Flow --      Pain Score 06/21/17 1204 0     Pain Loc --      Pain Edu? --      Excl. in [place]? --     Constitutional: Alert. No acute distress Eyes: Conjunctivae are normal.   Nose: No congestion/rhinnorhea. Mouth/Throat: Mucous membranes are  moist.    Cardiovascular: Normal rate, regular rhythm. Grossly normal heart sounds.  Good peripheral circulation. Respiratory: Normal respiratory effort.  No retractions. Gastrointestinal: Soft and nontender. No distention.    Musculoskeletal:   Warm and well perfused Neurologic:  Normal speech and language. No gross focal neurologic deficits are appreciated.  Skin:  Skin is warm, dry and intact. No rash noted.   ____________________________________________   LABS (all labs ordered are listed, but only abnormal results are displayed)  Labs Reviewed  COMPREHENSIVE METABOLIC PANEL - Abnormal; Notable for the following components:      Result Value   ALT <5 (*)    All other components within normal limits  CBC  CBG MONITORING, ED   ____________________________________________  EKG  None ____________________________________________  RADIOLOGY  Viewed CT head from 2 days ago, unremarkable ____________________________________________   PROCEDURES  Procedure(s) performed: No  Procedures   Critical Care performed: No ____________________________________________   INITIAL IMPRESSION / ASSESSMENT AND PLAN / ED COURSE  Pertinent labs & imaging results that were available during my care of the patient were reviewed by me and considered in my medical decision making (see chart for details).  Patient appears to be at her baseline, we will observe in the emergency department.  Lab work is reassuring, do not feel imaging is necessary.  Discussed with husband who agrees with plan to discharge with outpatient follow-up as needed.    ____________________________________________   FINAL CLINICAL IMPRESSION(S) / ED DIAGNOSES  Final diagnoses:  Transient alteration of awareness        Note:  This document was prepared using Dragon voice recognition software and may include unintentional dictation errors.    Lavonia Drafts, MD 06/21/17 [phone]

## 2017-06-21 NOTE — ED Notes (Signed)
MD Kinner at bedside to update family

## 2017-06-21 NOTE — ED Notes (Signed)
First Nurse Note: Pt to ED with husband who states that pt was unresponsive for approximately 1 hour this morning. Pt husband states that "pt was not waking up right". Per husband pt became responsive about 15 minutes ago.

## 2017-06-21 NOTE — ED Triage Notes (Signed)
Arrives with spouse who stated this morning when he went to wake patient up "She wasn't responding so good.  Her eyes were dilated".  States this occurred at 0800.  Spouse states patient is not "acting her norm".  Patient was seen through ED recently for the same type symptoms.

## 2017-07-06 ENCOUNTER — Other Ambulatory Visit: Payer: Self-pay

## 2017-07-06 ENCOUNTER — Emergency Department
Admission: EM | Admit: 2017-07-06 | Discharge: 2017-07-06 | Disposition: A | Discharge status: Home / Self Care | Payer: Medicare HMO | Attending: Emergency Medicine | Admitting: Emergency Medicine

## 2017-07-06 ENCOUNTER — Emergency Department: Payer: Medicare HMO

## 2017-07-06 ENCOUNTER — Encounter: Payer: Self-pay | Admitting: Emergency Medicine

## 2017-07-06 DIAGNOSIS — Z7982 Long term (current) use of aspirin: Secondary | ICD-10-CM | POA: Insufficient documentation

## 2017-07-06 DIAGNOSIS — F0390 Unspecified dementia without behavioral disturbance: Secondary | ICD-10-CM | POA: Diagnosis not present

## 2017-07-06 DIAGNOSIS — K5641 Fecal impaction: Secondary | ICD-10-CM

## 2017-07-06 DIAGNOSIS — Z79899 Other long term (current) drug therapy: Secondary | ICD-10-CM | POA: Diagnosis not present

## 2017-07-06 DIAGNOSIS — K5900 Constipation, unspecified: Secondary | ICD-10-CM

## 2017-07-06 DIAGNOSIS — G20 Parkinson's disease: Secondary | ICD-10-CM | POA: Insufficient documentation

## 2017-07-06 MED ORDER — LORAZEPAM 1 MG PO TABS
1.0000 mg | ORAL_TABLET | Freq: Once | ORAL | Status: AC
  Administered 2017-07-06: 1 mg via ORAL
  Filled 2017-07-06: qty 1

## 2017-07-06 MED ORDER — LIDOCAINE HCL URETHRAL/MUCOSAL 2 % EX GEL
1.0000 "application " | Freq: Once | CUTANEOUS | Status: AC
  Administered 2017-07-06: 1 via TOPICAL
  Filled 2017-07-06: qty 5

## 2017-07-06 NOTE — ED Notes (Signed)
Patient transported to CT 

## 2017-07-06 NOTE — Discharge Instructions (Addendum)
It was a pleasure to take care of you today, and thank you for coming to our emergency department.  If you have any questions or concerns before leaving please ask the nurse to grab me and I'm more than happy to go through your aftercare instructions again.  If you were prescribed any opioid pain medication today such as Norco, Vicodin, Percocet, morphine, hydrocodone, or oxycodone please make sure you do not drive when you are taking this medication as it can alter your ability to drive safely.  If you have any concerns once you are home that you are not improving or are in fact getting worse before you can make it to your follow-up appointment, please do not hesitate to call 911 and come back for further evaluation.  Darel Hong, MD  Results for orders placed or performed during the hospital encounter of 06/21/17  Comprehensive metabolic panel  Result Value Ref Range   Sodium 140 135 - 145 mmol/L   Potassium 3.9 3.5 - 5.1 mmol/L   Chloride 105 101 - 111 mmol/L   CO2 28 22 - 32 mmol/L   Glucose, Bld 98 65 - 99 mg/dL   BUN 17 6 - 20 mg/dL   Creatinine, Ser 0.87 0.44 - 1.00 mg/dL   Calcium 9.3 8.9 - 10.3 mg/dL   Total Protein 6.9 6.5 - 8.1 g/dL   Albumin 4.2 3.5 - 5.0 g/dL   AST 22 15 - 41 U/L   ALT <5 (L) 14 - 54 U/L   Alkaline Phosphatase 50 38 - 126 U/L   Total Bilirubin 1.2 0.3 - 1.2 mg/dL   GFR calc non Af Amer >60 >60 mL/min   GFR calc Af Amer >60 >60 mL/min   Anion gap 7 5 - 15  CBC  Result Value Ref Range   WBC 4.7 3.6 - 11.0 K/uL   RBC 4.13 3.80 - 5.20 MIL/uL   Hemoglobin 13.2 12.0 - 16.0 g/dL   HCT 39.2 35.0 - 47.0 %   MCV 95.0 80.0 - 100.0 fL   MCH 32.0 26.0 - 34.0 pg   MCHC 33.7 32.0 - 36.0 g/dL   RDW 13.8 11.5 - 14.5 %   Platelets 273 150 - 440 K/uL   Ct Abdomen Pelvis Wo Contrast  Result Date: 07/06/2017 CLINICAL DATA:  No bowel movement within 3 weeks. Dementia. Previous appendectomy. EXAM: CT ABDOMEN AND PELVIS WITHOUT CONTRAST TECHNIQUE: Multidetector CT  imaging of the abdomen and pelvis was performed following the standard protocol without IV contrast. COMPARISON:  04/28/2015 FINDINGS: Lower chest: No acute abnormality. Hepatobiliary: Normal. Pancreas: Normal. Spleen: Normal. Adrenals/Urinary Tract: Adrenal glands are normal. Kidneys are normal in size with mild bilateral nephrolithiasis. No hydronephrosis. No perinephric inflammation or fluid. Ureters and bladder are within normal. Stomach/Bowel: Stomach and small bowel are unremarkable. Appendix previously resected. Colon demonstrates moderate fecal retention most prominent over the rectum. Minimal wall thickening involving the rectum which may be due to impaction versus mild proctitis. Vascular/Lymphatic: Mild calcified plaque over the abdominal aorta. No adenopathy. Reproductive: 3.2 cm calcified uterine fibroid unchanged. Pelvic structures displaced anteriorly due to the moderate fecal retention over the rectum. 4.6 cm right ovarian cyst slightly increased in size (previously 3.4 cm). No solid component to this cystic structure. Other: No significant free fluid. Musculoskeletal: Moderate curvature of the lumbar spine convex left. Moderate spondylosis of the lumbar spine with multilevel disc space narrowing. Mild degenerate change of the hips. IMPRESSION: Moderate fecal retention throughout the colon with prominent fecal retention  over the rectum where there is mild wall thickening likely due to impaction, although could be seen with proctitis. Interval increase in size of a 4.6 cm simple appearing right ovarian cyst. Recommend ultrasound for further characterization. 3.2 cm calcified uterine fibroid. Bilateral nephrolithiasis. Aortic Atherosclerosis (ICD10-I70.0). Electronically Signed   By: Marin Olp M.D.   On: 07/06/2017 14:39   Dg Abdomen 1 View  Result Date: 07/06/2017 CLINICAL DATA:  Patient with constipation EXAM: ABDOMEN - 1 VIEW COMPARISON:  CT abdomen pelvis 04/28/2015 FINDINGS: Mild gaseous  distended loops of bowel throughout the abdomen. Large amount of stool within the descending colon and rectum. Lung bases are clear. Lumbar spine degenerative changes. IMPRESSION: Large amount of stool within the descending colon and rectum compatible with constipation. Mild gaseous distended loops of bowel within the abdomen suggestive of ileus. Electronically Signed   By: Lovey Newcomer M.D.   On: 07/06/2017 14:23   Ct Head Wo Contrast  Result Date: 06/19/2017 CLINICAL DATA:  Altered level of consciousness EXAM: CT HEAD WITHOUT CONTRAST TECHNIQUE: Contiguous axial images were obtained from the base of the skull through the vertex without intravenous contrast. COMPARISON:  05/25/2017 FINDINGS: Brain: No acute intracranial abnormality. Specifically, no hemorrhage, hydrocephalus, mass lesion, acute infarction, or significant intracranial injury. Vascular: No hyperdense vessel or unexpected calcification. Skull: No acute calvarial abnormality. Sinuses/Orbits: Visualized paranasal sinuses and mastoids clear. Orbital soft tissues unremarkable. Other: None IMPRESSION: No intracranial abnormality. Electronically Signed   By: Rolm Baptise M.D.   On: 06/19/2017 10:26   Dg Chest Portable 1 View  Result Date: 06/19/2017 CLINICAL DATA:  Weakness. EXAM: PORTABLE CHEST 1 VIEW COMPARISON:  Radiograph of May 25, 2017. FINDINGS: The heart size and mediastinal contours are within normal limits. Both lungs are clear. No pneumothorax or pleural effusion is noted. The visualized skeletal structures are unremarkable. IMPRESSION: No acute cardiopulmonary abnormality seen. Electronically Signed   By: Marijo Conception, M.D.   On: 06/19/2017 10:20

## 2017-07-06 NOTE — ED Provider Notes (Signed)
[facility] Emergency Department Provider Note  ____________________________________________   First MD Initiated Contact with Patient 07/06/17 1355     (approximate)  I have reviewed the triage vital signs and the nursing notes.   HISTORY  Chief Complaint Constipation  Level 5 exemption history is limited by the patient's severe dementia  HPI Jillian Vaughn is a 72 y.o. female who was brought to the emergency department by her husband with constipation.  The patient has not had a bowel movement in 3 weeks.  The patient is passing flatus.  She has a remote history of appendectomy.  No fevers or chills.  She is behaving roughly normally according to her husband.  Further history is limited by the patient's dementia.  Past Medical History:  Diagnosis Date  . Arthritis   . Dementia   . Parkinson disease [facility])     Patient Active Problem List   Diagnosis Date Noted  . Parkinson's disease ([place]) 06/10/2012    Past Surgical History:  Procedure Laterality Date  . APPENDECTOMY    . ESOPHAGOGASTRODUODENOSCOPY (EGD) WITH PROPOFOL N/A 06/19/2015   Procedure: ESOPHAGOGASTRODUODENOSCOPY (EGD) WITH PROPOFOL;  Surgeon: Lollie Sails, MD;  Location: [facility] ENDOSCOPY;  Service: Endoscopy;  Laterality: N/A;    Prior to Admission medications   Medication Sig Start Date End Date Taking? Authorizing Provider  acetaminophen (TYLENOL) 650 MG CR tablet Take 1,300 mg by mouth 2 (two) times daily as needed for pain.    [provider]  amantadine (SYMMETREL) 100 MG capsule Take 100 mg by mouth 2 (two) times daily. 03/23/15   [provider]  aspirin 325 MG tablet Take 325 mg by mouth daily.    [provider]  carbidopa-levodopa (SINEMET) 25-100 MG tablet Take 2 tablets by mouth 4 (four) times daily.  03/02/15   [provider]  lubiprostone (AMITIZA) 24 MCG capsule Take 24 mcg by mouth daily with breakfast.    [provider]    meloxicam (MOBIC) 7.5 MG tablet Take 7.5 mg by mouth daily.    [provider]  mirtazapine (REMERON) 7.5 MG tablet Take 7.5 mg by mouth at bedtime.    [provider]  pravastatin (PRAVACHOL) 40 MG tablet Take 40 mg by mouth at bedtime.    [provider]  Riboflavin (VITAMIN B-2) 50 MG TABS Take 50 mg by mouth daily.    [provider]  rOPINIRole (REQUIP) 0.5 MG tablet Take 0.5 mg by mouth daily.    [provider]  Vitamin D, Ergocalciferol, (DRISDOL) 50000 units CAPS capsule Take 50,000 Units by mouth every Wednesday. 04/18/15   [provider]    Allergies Patient has no known allergies.  No family history on file.  Social History Social History   Tobacco Use  . Smoking status: Never Smoker  . Smokeless tobacco: Never Used  Substance Use Topics  . Alcohol use: Yes    Comment: occ  . Drug use: No    Review of Systems Level 5 exemption history limited by the patient's dementia ____________________________________________   PHYSICAL EXAM:  VITAL SIGNS: ED Triage Vitals  Enc Vitals Group     BP 07/06/17 1252 132/75     Pulse Rate 07/06/17 1252 80     Resp 07/06/17 1252 16     Temp 07/06/17 1252 98.2 F (36.8 C)     Temp Source 07/06/17 1252 Oral     SpO2 07/06/17 1252 99 %     Weight --  Height --      Head Circumference --      Peak Flow --      Pain Score 07/06/17 1253 0     Pain Loc --      Pain Edu? --      Excl. in [place]? --     Constitutional: Severe dementia.  No acute distress. Eyes: PERRL EOMI. Head: Atraumatic. Nose: No congestion/rhinnorhea. Mouth/Throat: No trismus Neck: No stridor.   Cardiovascular: Normal rate, regular rhythm. Grossly normal heart sounds.  Good peripheral circulation. Respiratory: Normal respiratory effort.  No retractions. Lungs CTAB and moving good air Gastrointestinal: Somewhat distended abdomen with hyperactive bowel sounds nontender Musculoskeletal: No lower  extremity edema   Neurologic: Moves all 4 Skin:  Skin is warm, dry and intact. No rash noted. Psychiatric: Severe dementia   ____________________________________________   DIFFERENTIAL includes but not limited to  Small bowel obstruction, large bowel obstruction, volvulus, constipation, fecal impaction ____________________________________________   LABS (all labs ordered are listed, but only abnormal results are displayed)  Labs Reviewed - No data to display   __________________________________________  EKG   ____________________________________________  RADIOLOGY  CT abdomen pelvis noncontrast reviewed by me consistent with fecal impaction ____________________________________________   PROCEDURES  Procedure(s) performed: yes  ------------------------------------------------------------------------------------------------------------------- Fecal Disimpaction Procedure Note:  Performed by me:  Patient placed in the lateral recumbent position with knees drawn towards chest. Nurse present for patient support. Large amount of hard brown stool removed. No complications during procedure.   ------------------------------------------------------------------------------------------------------------------    Procedures  Critical Care performed: no  Observation: no ____________________________________________   INITIAL IMPRESSION / ASSESSMENT AND PLAN / ED COURSE  Pertinent labs & imaging results that were available during my care of the patient were reviewed by me and considered in my medical decision making (see chart for details).       ----------------------------------------- 1:56 PM on 07/06/2017 -----------------------------------------  The patient had a plain film x-ray prior to my evaluation, however this is inadequate to fully evaluate to see if she has had an obstruction etc.  CT scan without contrast is  pending. ____________________________________________  The patient's CT scan shows a fecal impaction with a large amount of stool.  The patient has severe dementia so I consented her husband for manual disimpaction.  Initially given 1 mg of lorazepam orally to help with anxiolysis.  I was able to remove a large amount of hard stool by placing the patient in right lateral decubitus and using viscous lidocaine for lubrication.  Patient feels improved thereafter.  Will discharge home with primary care follow-up.  Family verbalizes understanding and agreement with the plan.   FINAL CLINICAL IMPRESSION(S) / ED DIAGNOSES  Final diagnoses:  Constipation, unspecified constipation type  Fecal impaction ([place])      NEW MEDICATIONS STARTED DURING THIS VISIT:  Discharge Medication List as of 07/06/2017  3:30 PM       Note:  This document was prepared using Dragon voice recognition software and may include unintentional dictation errors.     Darel Hong, MD 07/06/17 [phone]

## 2017-07-06 NOTE — ED Triage Notes (Signed)
Pt to ED with husband who states that pt has not had a bowel movement in 3 weeks. Pt has hx/o dementia but states that she had a bowel movement last night but before that it had been a while. Pt husband states that he called here and spoke with MD who told him not to do anything at home just bring pt to ED.

## 2017-07-26 ENCOUNTER — Emergency Department
Admission: EM | Admit: 2017-07-26 | Discharge: 2017-07-27 | Disposition: A | Discharge status: Home / Self Care | Payer: Medicare HMO | Attending: Emergency Medicine | Admitting: Emergency Medicine

## 2017-07-26 ENCOUNTER — Emergency Department: Payer: Medicare HMO

## 2017-07-26 ENCOUNTER — Other Ambulatory Visit: Payer: Self-pay

## 2017-07-26 ENCOUNTER — Encounter: Payer: Self-pay | Admitting: Emergency Medicine

## 2017-07-26 DIAGNOSIS — W19XXXA Unspecified fall, initial encounter: Secondary | ICD-10-CM

## 2017-07-26 DIAGNOSIS — S0083XA Contusion of other part of head, initial encounter: Secondary | ICD-10-CM | POA: Diagnosis not present

## 2017-07-26 DIAGNOSIS — Y999 Unspecified external cause status: Secondary | ICD-10-CM | POA: Insufficient documentation

## 2017-07-26 DIAGNOSIS — S50811A Abrasion of right forearm, initial encounter: Secondary | ICD-10-CM | POA: Diagnosis not present

## 2017-07-26 DIAGNOSIS — W108XXA Fall (on) (from) other stairs and steps, initial encounter: Secondary | ICD-10-CM | POA: Diagnosis not present

## 2017-07-26 DIAGNOSIS — G20 Parkinson's disease: Secondary | ICD-10-CM | POA: Diagnosis not present

## 2017-07-26 DIAGNOSIS — Z7982 Long term (current) use of aspirin: Secondary | ICD-10-CM | POA: Insufficient documentation

## 2017-07-26 DIAGNOSIS — F0280 Dementia in other diseases classified elsewhere without behavioral disturbance: Secondary | ICD-10-CM | POA: Insufficient documentation

## 2017-07-26 DIAGNOSIS — S0990XA Unspecified injury of head, initial encounter: Secondary | ICD-10-CM | POA: Diagnosis present

## 2017-07-26 DIAGNOSIS — Z79899 Other long term (current) drug therapy: Secondary | ICD-10-CM | POA: Diagnosis not present

## 2017-07-26 DIAGNOSIS — S60222A Contusion of left hand, initial encounter: Secondary | ICD-10-CM | POA: Diagnosis not present

## 2017-07-26 DIAGNOSIS — T148XXA Other injury of unspecified body region, initial encounter: Secondary | ICD-10-CM

## 2017-07-26 DIAGNOSIS — S01511A Laceration without foreign body of lip, initial encounter: Secondary | ICD-10-CM

## 2017-07-26 DIAGNOSIS — T07XXXA Unspecified multiple injuries, initial encounter: Secondary | ICD-10-CM

## 2017-07-26 DIAGNOSIS — Y92018 Other place in single-family (private) house as the place of occurrence of the external cause: Secondary | ICD-10-CM | POA: Insufficient documentation

## 2017-07-26 DIAGNOSIS — Y9389 Activity, other specified: Secondary | ICD-10-CM | POA: Diagnosis not present

## 2017-07-26 NOTE — ED Notes (Signed)
Patient to waiting room via wheelchair by EMS from home after a fall.  EMS reports patient told them she just lost her balance and fell on brick pathway.  Patient with abrasion to her face and lip and right arm.  Denies loss of consciousness.  EMS vital signs:  HR - 90; BP 180/90; pulse oxi 95% on room air.
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
## 2017-07-27 MED ORDER — CEPHALEXIN 500 MG PO CAPS
500.0000 mg | ORAL_CAPSULE | Freq: Once | ORAL | Status: AC
  Administered 2017-07-27: 500 mg via ORAL
  Filled 2017-07-27: qty 1

## 2017-07-27 MED ORDER — BACITRACIN ZINC 500 UNIT/GM EX OINT
TOPICAL_OINTMENT | Freq: Once | CUTANEOUS | Status: AC
  Administered 2017-07-27: 02:00:00 via TOPICAL

## 2017-07-27 MED ORDER — ACETAMINOPHEN 325 MG PO TABS
650.0000 mg | ORAL_TABLET | Freq: Once | ORAL | Status: AC
  Administered 2017-07-27: 650 mg via ORAL
  Filled 2017-07-27: qty 2

## 2017-07-27 MED ORDER — LIDOCAINE HCL (PF) 1 % IJ SOLN
5.0000 mL | Freq: Once | INTRAMUSCULAR | Status: DC
Start: 2017-07-27 — End: 2017-07-27

## 2017-07-27 MED ORDER — BACITRACIN ZINC 500 UNIT/GM EX OINT
TOPICAL_OINTMENT | CUTANEOUS | Status: AC
  Filled 2017-07-27: qty 0.9

## 2017-07-27 MED ORDER — CEPHALEXIN 500 MG PO CAPS: 500.0000 mg | ORAL_CAPSULE | Freq: Two times a day (BID) | ORAL | 0 refills | Status: AC

## 2017-07-27 NOTE — Discharge Instructions (Signed)
As we discussed, you need to follow-up with your regular doctor within the next few days to see how your wounds are healing.  You can expect to be very stiff and sore for a week or longer.  We discussed repairing the laceration to your lip but agreed that it might be better to laceration to heal on its own please follow-up with your dentist to evaluate the tooth injury you sustained and stick to a soft diet into your lip is healed up and your tooth is feeling better.  Return to the emergency department if you develop new or worsening symptoms that concern you.

## 2017-07-27 NOTE — ED Notes (Signed)
Pt with abrasion noted to upper right lip, nose, right cheek, bilateral forearms, left anterior knee, left posterior lower hand. No bleeding noted. Pt states she did not loose consciousness when she fell. No vomiting or nausea noted. Pt has a rigid c collar in place.

## 2017-07-27 NOTE — ED Notes (Signed)
Rigid c collar removed by dr. Karma Greaser.

## 2017-07-27 NOTE — ED Provider Notes (Signed)
[facility] Emergency Department Provider Note  ____________________________________________   First MD Initiated Contact with Patient 07/27/17 0005     (approximate)  I have reviewed the triage vital signs and the nursing notes.   HISTORY  Chief Complaint Fall and Facial Pain  Level 5 caveat:  history/ROS may be limited by chronic dementia.  History provided primarily by husband.  HPI Jillian Vaughn is a 72 y.o. female with a history that includes Parkinson's, arthritis, and dementia who presents with multiple wounds after a fall.  She was ambulatory outside at home when she tripped on some bricks that were out of place, lost her balance, and fell on the pathway landing primarily on her face but also on her right arm left hand.  She did not lose consciousness.  She reports moderate pain in her face as well as her extremities.  She has obvious facial trauma and swelling to the upper lip and multiple skin tears right arm as well as some abrasions to her left hand.  She has chronic issues with her left hip as well as Parkinson's which makes it difficult she denies chest pain, shortness of breath, nausea, vomiting, and abdominal pain.  Reportedly she was "stunned" after the fall but she has been waiting for about 1/2 hours in the lobby and has not had any change in status.  She is not on any blood thinners and she denies headache and neck pain.  Past Medical History:  Diagnosis Date  . Arthritis   . Dementia   . Parkinson disease [facility])     Patient Active Problem List   Diagnosis Date Noted  . Parkinson's disease ([place]) 06/10/2012    Past Surgical History:  Procedure Laterality Date  . APPENDECTOMY    . ESOPHAGOGASTRODUODENOSCOPY (EGD) WITH PROPOFOL N/A 06/19/2015   Procedure: ESOPHAGOGASTRODUODENOSCOPY (EGD) WITH PROPOFOL;  Surgeon: Lollie Sails, MD;  Location: [facility] ENDOSCOPY;  Service: Endoscopy;  Laterality: N/A;    Prior to Admission medications     Medication Sig Start Date End Date Taking? Authorizing Provider  acetaminophen (TYLENOL) 650 MG CR tablet Take 1,300 mg by mouth 2 (two) times daily as needed for pain.   Yes [provider]  amantadine (SYMMETREL) 100 MG capsule Take 100 mg by mouth 2 (two) times daily. 03/23/15  Yes [provider]  aspirin 325 MG tablet Take 325 mg by mouth daily.   Yes [provider]  carbidopa-levodopa (SINEMET) 25-100 MG tablet Take 2 tablets by mouth 4 (four) times daily.  03/02/15  Yes [provider]  lubiprostone (AMITIZA) 24 MCG capsule Take 24 mcg by mouth daily with breakfast.   Yes [provider]  meloxicam (MOBIC) 7.5 MG tablet Take 7.5 mg by mouth daily.   Yes [provider]  mirtazapine (REMERON) 7.5 MG tablet Take 7.5 mg by mouth at bedtime.   Yes [provider]  pravastatin (PRAVACHOL) 40 MG tablet Take 40 mg by mouth at bedtime.   Yes [provider]  Riboflavin (VITAMIN B-2) 50 MG TABS Take 50 mg by mouth daily.   Yes [provider]  rOPINIRole (REQUIP) 0.5 MG tablet Take 0.5 mg by mouth daily.   Yes [provider]  Vitamin D, Ergocalciferol, (DRISDOL) 50000 units CAPS capsule Take 50,000 Units by mouth every Wednesday. 04/18/15  Yes [provider]  cephALEXin (KEFLEX) 500 MG capsule Take 1 capsule (500 mg total) by mouth 2 (two) times daily for 4 days. 07/27/17 07/31/17  Hinda Kehr, MD    Allergies Patient has no known allergies.  History reviewed. No pertinent family history.  Social History Social History   Tobacco Use  . Smoking status: Never Smoker  . Smokeless tobacco: Never Used  Substance Use Topics  . Alcohol use: Yes    Comment: occ  . Drug use: No    Review of Systems Level 5 caveat:  history/ROS may be limited by chronic dementia  Constitutional: No fever/chills Eyes: No visual changes. ENT: No sore throat. Cardiovascular: Denies chest pain. Respiratory:  Denies shortness of breath. Gastrointestinal: No abdominal pain.  No nausea, no vomiting.  No diarrhea.  No constipation. Genitourinary: Negative for dysuria. Musculoskeletal: Pain in her extremities after a fall with numerous skin tears and abrasions, obvious facial trauma mostly around the upper lip. Integumentary: Negative for rash. Neurological: Negative for headaches, focal weakness or numbness.   ____________________________________________   PHYSICAL EXAM:  VITAL SIGNS: ED Triage Vitals  Enc Vitals Group     BP 07/26/17 2022 (!) 181/88     Pulse Rate 07/26/17 2022 93     Resp 07/26/17 2022 18     Temp 07/26/17 2022 98.7 F (37.1 C)     Temp Source 07/26/17 2022 Oral     SpO2 07/26/17 2022 99 %     Weight 07/26/17 2027 36.3 kg (80 lb)     Height 07/26/17 2027 1.588 m (5' 2.5")     Head Circumference --      Peak Flow --      Pain Score --      Pain Loc --      Pain Edu? --      Excl. in [place]? --     Constitutional: Alert and oriented to person and place.  Elderly, appears frail, obvious trauma Eyes: Conjunctivae are normal. PERRL. EOMI. Head: Facial contusion and swelling to the upper lip mostly on the right side with maceration outer upper lip and 2 small lacerations to the mucosa of the upper lip.  There is a small avulsion of tooth #7 but there is no instability of any of the front teeth no other dental trauma. Nose: No congestion/rhinnorhea.  No epistaxis. Mouth/Throat: Mucous membranes are moist.  See above for lip wound inflammation. Neck: No stridor.  No meningeal signs.  No cervical spine tenderness to palpation.  Cleared cervical spine clinically after seeing the results of the CT scan, patient has no pain or tenderness with flexion and extension of her head. Cardiovascular: Normal rate, regular rhythm. Good peripheral circulation. Grossly normal heart sounds. Respiratory: Normal respiratory effort.  No retractions. Lungs CTAB. Gastrointestinal: Soft and  nontender. No distention.  Musculoskeletal: No lower extremity tenderness nor edema. No gross deformities of extremities.  See skin exam below for details regarding the multiple abrasions and skin tears.  There are no gross deformities of her extremities and I am able to flex, extend, abduct, and abduct both hips, flex and extend her knees, and fully range both of her ankles without any reproducible pain or tenderness.  She also has full range of motion of both shoulders, elbows, and wrists. Neurologic:  Normal speech and language. No gross focal neurologic deficits are appreciated.  Skin:  Multiple superficial but extensive skin tears primarily on right arm including elbow and forearm.  Cleansed and closed with skin adhesive and Steri-Strips by patient's nurse, April. Psychiatric: Mood and affect are normal. Speech and behavior are normal.  ____________________________________________   LABS (all labs ordered are  listed, but only abnormal results are displayed)  Labs Reviewed - No data to display ____________________________________________  EKG  No indication for EKG ____________________________________________  RADIOLOGY  ED MD interpretation:  No indication of fractures/dislocations, nor intracranial/vertebra injury, on radiographs and CT scans.  Official radiology report(s): Dg Forearm Right  Result Date: 07/26/2017 CLINICAL DATA:  Initial evaluation for acute trauma, fall. EXAM: RIGHT FOREARM - 2 VIEW COMPARISON:  None. FINDINGS: No acute fracture dislocation. Mild degenerative changes noted about the wrist and elbow. Few punctate radiopaque densities seen within the radial aspect of the mid right forearm, nonspecific, but could reflect small retained foreign bodies. No other acute soft tissue abnormality. IMPRESSION: 1. No acute fracture or dislocation. 2. Two punctate radiopaque densities within the radial soft tissues of the mid right forearm. Clinical correlation for possible small  retained foreign bodies recommended. Electronically Signed   By: Jeannine Boga M.D.   On: 07/26/2017 21:32   Ct Head Wo Contrast  Result Date: 07/26/2017 CLINICAL DATA:  Tripped going down front steps, with falls. Right cheek and right chin contusions. Upper left lip laceration and small chip at upper front tooth. Concern for head or cervical spine injury. EXAM: CT HEAD WITHOUT CONTRAST CT MAXILLOFACIAL WITHOUT CONTRAST CT CERVICAL SPINE WITHOUT CONTRAST TECHNIQUE: Multidetector CT imaging of the head, cervical spine, and maxillofacial structures were performed using the standard protocol without intravenous contrast. Multiplanar CT image reconstructions of the cervical spine and maxillofacial structures were also generated. COMPARISON:  CT of the head performed 06/19/2017 FINDINGS: CT HEAD FINDINGS Brain: No evidence of acute infarction, hemorrhage, hydrocephalus, extra-axial collection or mass lesion / mass effect. Prominence of the ventricles and sulci reflects mild cortical volume loss. Chronic lacunar infarcts are seen at the right basal ganglia. Mild cerebellar atrophy is noted. The brainstem and fourth ventricle are within normal limits. The basal ganglia are unremarkable in appearance. The cerebral hemispheres demonstrate grossly normal gray-white differentiation. No mass effect or midline shift is seen. Vascular: No hyperdense vessel or unexpected calcification. Skull: There is no evidence of fracture; visualized osseous structures are unremarkable in appearance. Other: No significant soft tissue abnormalities are seen. CT MAXILLOFACIAL FINDINGS Osseous: There is no evidence of fracture or dislocation. The maxilla appears intact. Degenerative change is noted at the temporomandibular joints bilaterally, with flattening of the mandibular condylar heads. The nasal bone is unremarkable in appearance. The fractured frontal maxillary tooth is not well characterized on CT. Orbits: The orbits are intact  bilaterally. Sinuses: The visualized paranasal sinuses and mastoid air cells are well-aerated. Soft tissues: Mild soft tissue swelling is noted at the right side of the chin. The parapharyngeal fat planes are preserved. The nasopharynx, oropharynx and hypopharynx are unremarkable in appearance. The visualized portions of the valleculae and piriform sinuses are grossly unremarkable. The parotid and submandibular glands are within normal limits. No cervical lymphadenopathy is seen. CT CERVICAL SPINE FINDINGS Alignment: Normal. Skull base and vertebrae: No acute fracture. No primary bone lesion or focal pathologic process. Soft tissues and spinal canal: No prevertebral fluid or swelling. No visible canal hematoma. Disc levels: Mild multilevel disc space narrowing is noted along the lower cervical spine, with scattered anterior and posterior disc osteophyte complexes. Upper chest: The visualized lung apices are clear. The thyroid gland is unremarkable. Mild calcification is noted at the carotid bifurcations bilaterally. Other: IMPRESSION: 1. No evidence of traumatic intracranial injury or fracture. 2. No evidence of fracture or dislocation with regard to the maxillofacial structures. 3. No evidence of fracture  or subluxation along the cervical spine. 4. Mild soft tissue swelling at the right side of the chin. 5. Mild cortical volume loss. Chronic lacunar infarcts at the right basal ganglia. 6. Degenerative change at the temporomandibular joints bilaterally, with flattening of the mandibular condylar heads. 7. Known fractured frontal maxillary tooth is not well characterized on CT. Electronically Signed   By: Garald Balding M.D.   On: 07/26/2017 23:12   Ct Cervical Spine Wo Contrast  Result Date: 07/26/2017 CLINICAL DATA:  Tripped going down front steps, with falls. Right cheek and right chin contusions. Upper left lip laceration and small chip at upper front tooth. Concern for head or cervical spine injury. EXAM: CT  HEAD WITHOUT CONTRAST CT MAXILLOFACIAL WITHOUT CONTRAST CT CERVICAL SPINE WITHOUT CONTRAST TECHNIQUE: Multidetector CT imaging of the head, cervical spine, and maxillofacial structures were performed using the standard protocol without intravenous contrast. Multiplanar CT image reconstructions of the cervical spine and maxillofacial structures were also generated. COMPARISON:  CT of the head performed 06/19/2017 FINDINGS: CT HEAD FINDINGS Brain: No evidence of acute infarction, hemorrhage, hydrocephalus, extra-axial collection or mass lesion / mass effect. Prominence of the ventricles and sulci reflects mild cortical volume loss. Chronic lacunar infarcts are seen at the right basal ganglia. Mild cerebellar atrophy is noted. The brainstem and fourth ventricle are within normal limits. The basal ganglia are unremarkable in appearance. The cerebral hemispheres demonstrate grossly normal gray-white differentiation. No mass effect or midline shift is seen. Vascular: No hyperdense vessel or unexpected calcification. Skull: There is no evidence of fracture; visualized osseous structures are unremarkable in appearance. Other: No significant soft tissue abnormalities are seen. CT MAXILLOFACIAL FINDINGS Osseous: There is no evidence of fracture or dislocation. The maxilla appears intact. Degenerative change is noted at the temporomandibular joints bilaterally, with flattening of the mandibular condylar heads. The nasal bone is unremarkable in appearance. The fractured frontal maxillary tooth is not well characterized on CT. Orbits: The orbits are intact bilaterally. Sinuses: The visualized paranasal sinuses and mastoid air cells are well-aerated. Soft tissues: Mild soft tissue swelling is noted at the right side of the chin. The parapharyngeal fat planes are preserved. The nasopharynx, oropharynx and hypopharynx are unremarkable in appearance. The visualized portions of the valleculae and piriform sinuses are grossly  unremarkable. The parotid and submandibular glands are within normal limits. No cervical lymphadenopathy is seen. CT CERVICAL SPINE FINDINGS Alignment: Normal. Skull base and vertebrae: No acute fracture. No primary bone lesion or focal pathologic process. Soft tissues and spinal canal: No prevertebral fluid or swelling. No visible canal hematoma. Disc levels: Mild multilevel disc space narrowing is noted along the lower cervical spine, with scattered anterior and posterior disc osteophyte complexes. Upper chest: The visualized lung apices are clear. The thyroid gland is unremarkable. Mild calcification is noted at the carotid bifurcations bilaterally. Other: IMPRESSION: 1. No evidence of traumatic intracranial injury or fracture. 2. No evidence of fracture or dislocation with regard to the maxillofacial structures. 3. No evidence of fracture or subluxation along the cervical spine. 4. Mild soft tissue swelling at the right side of the chin. 5. Mild cortical volume loss. Chronic lacunar infarcts at the right basal ganglia. 6. Degenerative change at the temporomandibular joints bilaterally, with flattening of the mandibular condylar heads. 7. Known fractured frontal maxillary tooth is not well characterized on CT. Electronically Signed   By: Garald Balding M.D.   On: 07/26/2017 23:12   Dg Knee Complete 4 Views Left  Result Date: 07/26/2017 CLINICAL DATA:  Initial evaluation for acute trauma, fall. EXAM: LEFT KNEE - COMPLETE 4+ VIEW COMPARISON:  None. FINDINGS: No acute fracture or dislocation. No joint effusion. Joint spaces fairly well-maintained without significant degenerative or erosive arthropathy. No soft tissue abnormality. IMPRESSION: No acute abnormality about the left knee. Electronically Signed   By: Jeannine Boga M.D.   On: 07/26/2017 21:26   Dg Hand Complete Left  Result Date: 07/26/2017 CLINICAL DATA:  Initial evaluation for acute trauma, fall. EXAM: LEFT HAND - COMPLETE 3+ VIEW COMPARISON:   None. FINDINGS: No acute fracture or dislocation. Ulnar subluxation at the left second DIP joint favored to be chronic and degenerative. Additional mild to moderate degenerative osteoarthritic changes noted throughout the remaining PIP and DIP joints. Osseous mineralization normal. No acute soft tissue abnormality. IMPRESSION: 1. No acute abnormality about the left hand. 2. 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 Correlation physical exam recommended. 3. Additional mild to moderate degenerative osteoarthritic changes throughout the remaining DIP and PIP joints. Electronically Signed   By: Jeannine Boga M.D.   On: 07/26/2017 21:29   Ct Maxillofacial Wo Contrast  Result Date: 07/26/2017 CLINICAL DATA:  Tripped going down front steps, with falls. Right cheek and right chin contusions. Upper left lip laceration and small chip at upper front tooth. Concern for head or cervical spine injury. EXAM: CT HEAD WITHOUT CONTRAST CT MAXILLOFACIAL WITHOUT CONTRAST CT CERVICAL SPINE WITHOUT CONTRAST TECHNIQUE: Multidetector CT imaging of the head, cervical spine, and maxillofacial structures were performed using the standard protocol without intravenous contrast. Multiplanar CT image reconstructions of the cervical spine and maxillofacial structures were also generated. COMPARISON:  CT of the head performed 06/19/2017 FINDINGS: CT HEAD FINDINGS Brain: No evidence of acute infarction, hemorrhage, hydrocephalus, extra-axial collection or mass lesion / mass effect. Prominence of the ventricles and sulci reflects mild cortical volume loss. Chronic lacunar infarcts are seen at the right basal ganglia. Mild cerebellar atrophy is noted. The brainstem and fourth ventricle are within normal limits. The basal ganglia are unremarkable in appearance. The cerebral hemispheres demonstrate grossly normal gray-white differentiation. No mass effect or  midline shift is seen. Vascular: No hyperdense vessel or unexpected calcification. Skull: There is no evidence of fracture; visualized osseous structures are unremarkable in appearance. Other: No significant soft tissue abnormalities are seen. CT MAXILLOFACIAL FINDINGS Osseous: There is no evidence of fracture or dislocation. The maxilla appears intact. Degenerative change is noted at the temporomandibular joints bilaterally, with flattening of the mandibular condylar heads. The nasal bone is unremarkable in appearance. The fractured frontal maxillary tooth is not well characterized on CT. Orbits: The orbits are intact bilaterally. Sinuses: The visualized paranasal sinuses and mastoid air cells are well-aerated. Soft tissues: Mild soft tissue swelling is noted at the right side of the chin. The parapharyngeal fat planes are preserved. The nasopharynx, oropharynx and hypopharynx are unremarkable in appearance. The visualized portions of the valleculae and piriform sinuses are grossly unremarkable. The parotid and submandibular glands are within normal limits. No cervical lymphadenopathy is seen. CT CERVICAL SPINE FINDINGS Alignment: Normal. Skull base and vertebrae: No acute fracture. No primary bone lesion or focal pathologic process. Soft tissues and spinal canal: No prevertebral fluid or swelling. No visible canal hematoma. Disc levels: Mild multilevel disc space narrowing is noted along the lower cervical spine, with scattered anterior and posterior disc osteophyte complexes. Upper chest: The visualized lung apices are clear. The thyroid gland is unremarkable. Mild calcification is noted  at the carotid bifurcations bilaterally. Other: IMPRESSION: 1. No evidence of traumatic intracranial injury or fracture. 2. No evidence of fracture or dislocation with regard to the maxillofacial structures. 3. No evidence of fracture or subluxation along the cervical spine. 4. Mild soft tissue swelling at the right side of the  chin. 5. Mild cortical volume loss. Chronic lacunar infarcts at the right basal ganglia. 6. Degenerative change at the temporomandibular joints bilaterally, with flattening of the mandibular condylar heads. 7. Known fractured frontal maxillary tooth is not well characterized on CT. Electronically Signed   By: Garald Balding M.D.   On: 07/26/2017 23:12    ____________________________________________   PROCEDURES  Critical Care performed: No   Procedure(s) performed:   Procedures   ____________________________________________   INITIAL IMPRESSION / ASSESSMENT AND PLAN / ED COURSE  As part of my medical decision making, I reviewed the following data within the [place] History obtained from family, Nursing notes reviewed and incorporated, Radiograph reviewed  and Notes from prior ED visits    Differential diagnosis includes, but is not limited to, mechanical fall secondary to her chronic mobility issues and on uneven ground, cardiogenic syncope or near syncope, acute infection.  The patient and her husband both are comfortable with the idea that this was a mechanical fall and that does seem most likely.  She has no evidence of any acute fractures or dislocations on her imaging.  She has numerous skin tears with evidence of prior injuries including a subacute skin tear on her left forearm that is covered in a bandage.  Her nurse, April, was able to carefully cleanse and closed the wounds with skin adhesive and Steri-Strips.  No lacerations needed to be repaired.  I talked to her and her husband about putting 1 or 2 sutures in her lip laceration which is relatively superficial but could be painful when she tries to eat, but I also brought up the possibility of allowing it to heal by secondary intention since she will likely need to stick with a soft diet anyway.  She and her husband are going to discuss it and she will decide if she wants me to place the sutures.  I believe it  will heal well either way and given the obvious external contusion and maceration of the lip, she will likely be more comfortable sticking with a soft diet anyway.  Her tetanus is up-to-date within the last 5 years.  No acute distress in spite of obvious traumatic injuries.  Clinical Course as of Jul 28 507  Sun Jul 27, 2017  0140 Patient changed her mind and does not want to suture on her relatively superficial mucosal lip laceration, which I think is understandable and appropriate.  She should be able.  I gave my usual and customary return precautions and follow-up recommendations and husband understands and agrees.    [CF]    Clinical Course User Index [CF] Hinda Kehr, MD    ____________________________________________  FINAL CLINICAL IMPRESSION(S) / ED DIAGNOSES  Final diagnoses:  Fall, initial encounter  Contusion of face, initial encounter  Lip laceration, initial encounter  Multiple skin tears  Abrasions of multiple sites  Contusion of left hand, initial encounter     MEDICATIONS GIVEN DURING THIS VISIT:  Medications  lidocaine (PF) (XYLOCAINE) 1 % injection 5 mL (has no administration in time range)  acetaminophen (TYLENOL) tablet 650 mg (650 mg Oral Given 07/27/17 0156)  cephALEXin (KEFLEX) capsule 500 mg (500 mg Oral Given 07/27/17 0156)  bacitracin ointment ( Topical Given 07/27/17 0212)     ED Discharge Orders        Ordered    cephALEXin (KEFLEX) 500 MG capsule  2 times daily     07/27/17 0149       Note:  This document was prepared using Dragon voice recognition software and may include unintentional dictation errors.    Hinda Kehr, MD 07/27/17 [phone]

## 2017-07-27 NOTE — ED Notes (Signed)
Pt with skin tear noted to right forearm x4. Pt with skin tear over right medial posterior wrist, right lower posterior forearm, right mid forearm, and right lateral elbow. Pt with skin tear approx 8cm and linear to mid forearm, right medial posterior wrist 2cm, right lower posterior forearm 2cm and "u" shaped, and right lateral elbow is approx 1cm. Skin tears cleansed, steri strips applied and non stick dressing applied over steristrips. Dressings secured with cotton kling wrap.

## 2017-09-17 ENCOUNTER — Emergency Department
Admission: EM | Admit: 2017-09-17 | Discharge: 2017-09-18 | Disposition: A | Discharge status: Home / Self Care | Payer: Medicare HMO | Attending: Emergency Medicine | Admitting: Emergency Medicine

## 2017-09-17 ENCOUNTER — Encounter: Payer: Self-pay | Admitting: Emergency Medicine

## 2017-09-17 ENCOUNTER — Emergency Department: Payer: Medicare HMO

## 2017-09-17 ENCOUNTER — Other Ambulatory Visit: Payer: Self-pay

## 2017-09-17 DIAGNOSIS — S0990XA Unspecified injury of head, initial encounter: Secondary | ICD-10-CM | POA: Diagnosis present

## 2017-09-17 DIAGNOSIS — Y999 Unspecified external cause status: Secondary | ICD-10-CM | POA: Diagnosis not present

## 2017-09-17 DIAGNOSIS — Z7982 Long term (current) use of aspirin: Secondary | ICD-10-CM | POA: Insufficient documentation

## 2017-09-17 DIAGNOSIS — W01198A Fall on same level from slipping, tripping and stumbling with subsequent striking against other object, initial encounter: Secondary | ICD-10-CM | POA: Diagnosis not present

## 2017-09-17 DIAGNOSIS — W19XXXA Unspecified fall, initial encounter: Secondary | ICD-10-CM

## 2017-09-17 DIAGNOSIS — Z79899 Other long term (current) drug therapy: Secondary | ICD-10-CM | POA: Diagnosis not present

## 2017-09-17 DIAGNOSIS — F0390 Unspecified dementia without behavioral disturbance: Secondary | ICD-10-CM | POA: Insufficient documentation

## 2017-09-17 DIAGNOSIS — Y93E8 Activity, other personal hygiene: Secondary | ICD-10-CM | POA: Diagnosis not present

## 2017-09-17 DIAGNOSIS — S0003XA Contusion of scalp, initial encounter: Secondary | ICD-10-CM | POA: Diagnosis not present

## 2017-09-17 DIAGNOSIS — G20 Parkinson's disease: Secondary | ICD-10-CM | POA: Diagnosis not present

## 2017-09-17 DIAGNOSIS — Y92002 Bathroom of unspecified non-institutional (private) residence single-family (private) house as the place of occurrence of the external cause: Secondary | ICD-10-CM | POA: Insufficient documentation

## 2017-09-17 LAB — BASIC METABOLIC PANEL
ANION GAP: 9 (ref 5–15)
BUN: 20 mg/dL (ref 8–23)
CALCIUM: 9.4 mg/dL (ref 8.9–10.3)
CO2: 28 mmol/L (ref 22–32)
Chloride: 104 mmol/L (ref 98–111)
Creatinine, Ser: 0.8 mg/dL (ref 0.44–1.00)
GFR calc Af Amer: >60 mL/min (ref >60)
GLUCOSE: 90 mg/dL (ref 70–99)
Potassium: 4 mmol/L (ref 3.5–5.1)
Sodium: 141 mmol/L (ref 135–145)

## 2017-09-17 LAB — URINALYSIS, COMPLETE (UACMP) WITH MICROSCOPIC
BACTERIA UA: NONE SEEN
Bilirubin Urine: NEGATIVE
GLUCOSE, UA: NEGATIVE mg/dL
Hgb urine dipstick: NEGATIVE
Ketones, ur: 5 mg/dL — AB
Leukocytes, UA: NEGATIVE
Nitrite: NEGATIVE
PROTEIN: NEGATIVE mg/dL
SPECIFIC GRAVITY, URINE: 1.009 (ref 1.005–1.030)
Squamous Epithelial / LPF: NONE SEEN (ref 0–5)
pH: 7 (ref 5.0–8.0)

## 2017-09-17 LAB — CBC
HCT: 40.6 % (ref 35.0–47.0)
HEMOGLOBIN: 14 g/dL (ref 12.0–16.0)
MCH: 33.2 pg (ref 26.0–34.0)
MCHC: 34.4 g/dL (ref 32.0–36.0)
MCV: 96.3 fL (ref 80.0–100.0)
Platelets: 296 10*3/uL (ref 150–440)
RBC: 4.22 MIL/uL (ref 3.80–5.20)
RDW: 13.6 % (ref 11.5–14.5)
WBC: 6.1 10*3/uL (ref 3.6–11.0)

## 2017-09-17 LAB — GLUCOSE, CAPILLARY: GLUCOSE-CAPILLARY: 63 mg/dL — AB (ref 70–99)

## 2017-09-17 MED ORDER — DEXTROSE 5 % IV BOLUS
1000.0000 mL | Freq: Once | INTRAVENOUS | Status: AC
  Administered 2017-09-17: 1000 mL via INTRAVENOUS
  Filled 2017-09-17: qty 1000

## 2017-09-17 NOTE — ED Notes (Signed)
MD notified of low blood sugar - 63 - per MD hold giving any juice/dextrose at this time.

## 2017-09-17 NOTE — ED Provider Notes (Signed)
[facility] Emergency Department Provider Note  ____________________________________________  Time seen: Approximately 9:20 PM  I have reviewed the triage vital signs and the nursing notes.   HISTORY  Chief Complaint Fall    HPI Jillian Vaughn is a 72 y.o. female a history of dementia and Parkinson's disease who was in her usual state of health today when she went to the bathroom, use the toilet, and then when she went to stand up she slipped on the rug, falling to the floor and hitting her head on the ground.  Her husband came and noticed that she had been knocked out by the fall.  He helped her to the bed when she awoke, and then called EMS because she seemed to be somewhat disoriented afterward.  She currently denies any complaints.  Denies headache or neck pain.  Denies any preceding pain syndromes.  Denies any hip pain or other concerns at this time.  No shortness of breath.  Normal oral intake recently.      Past Medical History:  Diagnosis Date  . Arthritis   . Dementia   . Parkinson disease [facility])      Patient Active Problem List   Diagnosis Date Noted  . Parkinson's disease ([place]) 06/10/2012     Past Surgical History:  Procedure Laterality Date  . APPENDECTOMY    . ESOPHAGOGASTRODUODENOSCOPY (EGD) WITH PROPOFOL N/A 06/19/2015   Procedure: ESOPHAGOGASTRODUODENOSCOPY (EGD) WITH PROPOFOL;  Surgeon: Lollie Sails, MD;  Location[facility] ENDOSCOPY;  Service: Endoscopy;  Laterality: N/A;     Prior to Admission medications   Medication Sig Start Date End Date Taking? Authorizing Provider  acetaminophen (TYLENOL) 650 MG CR tablet Take 1,300 mg by mouth 2 (two) times daily as needed for pain.    [provider]  amantadine (SYMMETREL) 100 MG capsule Take 100 mg by mouth 2 (two) times daily. 03/23/15   [provider]  aspirin 325 MG tablet Take 325 mg by mouth daily.    [provider]  carbidopa-levodopa (SINEMET) 25-100 MG  tablet Take 2 tablets by mouth 4 (four) times daily.  03/02/15   [provider]  lubiprostone (AMITIZA) 24 MCG capsule Take 24 mcg by mouth daily with breakfast.    [provider]  meloxicam (MOBIC) 7.5 MG tablet Take 7.5 mg by mouth daily.    [provider]  mirtazapine (REMERON) 7.5 MG tablet Take 7.5 mg by mouth at bedtime.    [provider]  pravastatin (PRAVACHOL) 40 MG tablet Take 40 mg by mouth at bedtime.    [provider]  Riboflavin (VITAMIN B-2) 50 MG TABS Take 50 mg by mouth daily.    [provider]  rOPINIRole (REQUIP) 0.5 MG tablet Take 0.5 mg by mouth daily.    [provider]  Vitamin D, Ergocalciferol, (DRISDOL) 50000 units CAPS capsule Take 50,000 Units by mouth every Wednesday. 04/18/15   [provider]     Allergies Patient has no known allergies.   No family history on file.  Social History Social History   Tobacco Use  . Smoking status: Never Smoker  . Smokeless tobacco: Never Used  Substance Use Topics  . Alcohol use: Yes  . Drug use: No    Review of Systems  Constitutional:   No fever or chills.  ENT:   No sore throat. No rhinorrhea.  No epistaxis Cardiovascular:   No chest pain or syncope. Respiratory:   No dyspnea or cough. Gastrointestinal:  Negative for abdominal pain, vomiting and diarrhea.  Musculoskeletal:   Negative for focal pain or swelling All other systems reviewed and are negative except as documented above in ROS and HPI.  ____________________________________________   PHYSICAL EXAM:  VITAL SIGNS: ED Triage Vitals [09/17/17 1831]  Enc Vitals Group     BP (!) 157/84     Pulse Rate 69     Resp 12     Temp      Temp src      SpO2 100 %     Weight 81 lb 3.2 oz (36.8 kg)     Height 5\' 2"  (1.575 m)     Head Circumference      Peak Flow      Pain Score 0     Pain Loc      Pain Edu?      Excl. in [place]?     Vital signs reviewed, nursing assessments  reviewed.   Constitutional:   Alert and oriented.  Chronically ill-appearing, not in distress.  Emaciated Eyes:   Conjunctivae are normal. EOMI. PERRL. ENT      Head:   Normocephalic and atraumatic.      Nose:   No congestion/rhinnorhea.       Mouth/Throat:   MMM, no pharyngeal erythema. No peritonsillar mass.       Neck:   No meningismus. Full ROM.  No midline spinal tenderness. Hematological/Lymphatic/Immunilogical:   No cervical lymphadenopathy. Cardiovascular:   RRR. Symmetric bilateral radial and DP pulses.  No murmurs. Cap refill less than 2 seconds. Respiratory:   Normal respiratory effort without tachypnea/retractions. Breath sounds are clear and equal bilaterally. No wheezes/rales/rhonchi. Gastrointestinal:   Soft and nontender. Non distended. There is no CVA tenderness.  No rebound, rigidity, or guarding.  Musculoskeletal:   Normal range of motion in all extremities. No joint effusions.  No lower extremity tenderness.  No edema. Neurologic:   Normal speech and language.  Motor grossly intact. No acute focal neurologic deficits are appreciated.  Skin:    Skin is warm, dry and intact. No rash noted.  No petechiae, purpura, or bullae.  ____________________________________________    LABS (pertinent positives/negatives) (all labs ordered are listed, but only abnormal results are displayed) Labs Reviewed  URINALYSIS, COMPLETE (UACMP) WITH MICROSCOPIC - Abnormal; Notable for the following components:      Result Value   Color, Urine STRAW (*)    APPearance CLEAR (*)    Ketones, ur 5 (*)    All other components within normal limits  GLUCOSE, CAPILLARY - Abnormal; Notable for the following components:   Glucose-Capillary 63 (*)    All other components within normal limits  BASIC METABOLIC PANEL  CBC  CBG MONITORING, ED   ____________________________________________   EKG  Interpreted by me Sinus rhythm rate of 70.  Normal axis and intervals.  Normal QRS ST segments  and T waves.  ____________________________________________    RADIOLOGY  No results found.  ____________________________________________   PROCEDURES Procedures  ____________________________________________  DIFFERENTIAL DIAGNOSIS   Subdural hematoma, C-spine fracture, concussion, dehydration  CLINICAL IMPRESSION / ASSESSMENT AND PLAN / ED COURSE  Pertinent labs & imaging results that were available during my care of the patient were reviewed by me and considered in my medical decision making (see chart for details).    Patient presents after a mechanical fall causing loss of consciousness.  Not on blood thinners.  Vital signs are unremarkable, neurologically intact.  Blood sugar 63.  Will give D5 bolus while  awaiting CT results.  Check labs urinalysis CT head and neck.    ----------------------------------------- 9:23 PM on 09/17/2017 -----------------------------------------  Urinalysis normal.  CT results still pending.  Case signed out to Dr. Jimmye Norman to follow-up on CT.  If negative I think patient can be discharged home.  Patient and husband are in agreement with this plan.      ____________________________________________   FINAL CLINICAL IMPRESSION(S) / ED DIAGNOSES    Final diagnoses:  Fall, initial encounter  Contusion of scalp, initial encounter     ED Discharge Orders    None      Portions of this note were generated with dragon dictation software. Dictation errors may occur despite best attempts at proofreading.    Carrie Mew, MD 09/17/17 2123

## 2017-09-17 NOTE — ED Triage Notes (Signed)
Pt in via [facility] from home, reports fall while in bathroom; states she was reaching for a towel and as she was bending over, rug came out from under her.  Pt reports hitting head on toilet, denies LOC, denies blood thinners.  Pt A/Ox 4 upon arrival, difficulty holding a conversation.  Vitals WDL.

## 2017-09-17 NOTE — ED Notes (Signed)
Attempted to straight cath pt - No urine returned.

## 2017-09-17 NOTE — ED Notes (Signed)
Per husband, pt found passed out in bathroom, reports syncopal episode while sitting on toilet.  EDP notified.

## 2017-09-17 NOTE — ED Provider Notes (Signed)
CTs are negative, she is cleared for outpatient follow-up.   Earleen Newport, MD 09/17/17 [phone]

## 2017-09-18 LAB — GLUCOSE, CAPILLARY: GLUCOSE-CAPILLARY: 135 mg/dL — AB (ref 70–99)

## 2017-09-19 ENCOUNTER — Other Ambulatory Visit: Payer: Self-pay | Admitting: *Deleted

## 2017-09-19 NOTE — Patient Outreach (Signed)
[place] [facility]) Care Management  09/19/2017  Jillian Vaughn 1945/04/26 [id number]   Telephone Screen  Referral Date:  09/19/2017 Referral Source:  [facility] ED Census Reason for Referral:  6 or more ED visits in the past 6 months Insurance:  [name]   Outreach Attempt:  Attempted telephone outreach to patient for telephone screening, both numbers on file non working numbers.  Unable to complete calls or leave message.    Plan:   RN Health Coach will send patient Unsuccessful letter and wait a response.   [place] [phone] [email or web address]

## 2017-09-22 ENCOUNTER — Other Ambulatory Visit: Payer: Self-pay | Admitting: *Deleted

## 2017-09-22 NOTE — Patient Outreach (Signed)
[place] [facility]) Care Management  09/22/2017  BRIHANNA DEVENPORT 1945/11/23 [id number]   Telephone Screen  Referral Date:  09/19/2017 Referral Source:  [facility] ED Census Reason for Referral:  6 or more ED visits in the past 6 months Insurance:  [name]   Outreach Attempt:  Outreach attempt #2 to patient for telephone screening.  Both numbers listed not working numbers.  RN Health Coach outreach to primary care provider's office for telephone number they have on file.  Spoke with Manuela Schwartz at Dr. De [facility]ingshead office and given the number [phone].  Outreached to this number.  No answer. RN Health Coach left HIPAA compliant voicemail message along with contact information.  Plan:  RN Health Coach will make another outreach attempt to patient within 3-4 business days if no return call back from patient.  [place] [phone] [email or web address]

## 2017-09-23 ENCOUNTER — Emergency Department
Admission: EM | Admit: 2017-09-23 | Discharge: 2017-09-23 | Disposition: A | Discharge status: Home / Self Care | Payer: Medicare HMO | Attending: Emergency Medicine | Admitting: Emergency Medicine

## 2017-09-23 ENCOUNTER — Emergency Department: Payer: Medicare HMO

## 2017-09-23 ENCOUNTER — Encounter: Payer: Self-pay | Admitting: Emergency Medicine

## 2017-09-23 ENCOUNTER — Other Ambulatory Visit: Payer: Self-pay

## 2017-09-23 DIAGNOSIS — Z79899 Other long term (current) drug therapy: Secondary | ICD-10-CM | POA: Diagnosis not present

## 2017-09-23 DIAGNOSIS — Z7982 Long term (current) use of aspirin: Secondary | ICD-10-CM | POA: Diagnosis not present

## 2017-09-23 DIAGNOSIS — S0990XA Unspecified injury of head, initial encounter: Secondary | ICD-10-CM | POA: Insufficient documentation

## 2017-09-23 DIAGNOSIS — Y999 Unspecified external cause status: Secondary | ICD-10-CM | POA: Insufficient documentation

## 2017-09-23 DIAGNOSIS — W01198A Fall on same level from slipping, tripping and stumbling with subsequent striking against other object, initial encounter: Secondary | ICD-10-CM | POA: Diagnosis not present

## 2017-09-23 DIAGNOSIS — G20 Parkinson's disease: Secondary | ICD-10-CM | POA: Insufficient documentation

## 2017-09-23 DIAGNOSIS — Y929 Unspecified place or not applicable: Secondary | ICD-10-CM | POA: Diagnosis not present

## 2017-09-23 DIAGNOSIS — F0390 Unspecified dementia without behavioral disturbance: Secondary | ICD-10-CM | POA: Insufficient documentation

## 2017-09-23 DIAGNOSIS — W19XXXA Unspecified fall, initial encounter: Secondary | ICD-10-CM

## 2017-09-23 DIAGNOSIS — Y939 Activity, unspecified: Secondary | ICD-10-CM | POA: Insufficient documentation

## 2017-09-23 NOTE — ED Triage Notes (Signed)
Was in chair and fem member says she tripped on a rug and fell hitting head on tile floor.  Says she was unconsious for a short period and then would not answer right.

## 2017-09-23 NOTE — ED Notes (Signed)
..   Pt is resting, Respirations even and unlabored, NAD. Stretcher lowest postion and locked. Call bell within reach. Denies any needs at this time RN will continue to monitor. Significant other at bedside.   

## 2017-09-23 NOTE — ED Provider Notes (Signed)
[facility] Emergency Department Provider Note   ____________________________________________    I have reviewed the triage vital signs and the nursing notes.   HISTORY  Chief Complaint Fall and Loss of Consciousness   History of dementia Parkinson's disease, history limited, provided by husband  HPI Jillian Vaughn is a 72 y.o. female who presents after reported mechanical fall.  Husband reports that the patient tripped and fell forward striking the left side of her head quite hard on the floor.  She seems dazed and confused after this.  Review of medical records demonstrates a history of frequent falls and visits to the emergency department.  She is not on blood thinners   Past Medical History:  Diagnosis Date  . Arthritis   . Dementia   . Parkinson disease [facility])     Patient Active Problem List   Diagnosis Date Noted  . Parkinson's disease ([place]) 06/10/2012    Past Surgical History:  Procedure Laterality Date  . APPENDECTOMY    . ESOPHAGOGASTRODUODENOSCOPY (EGD) WITH PROPOFOL N/A 06/19/2015   Procedure: ESOPHAGOGASTRODUODENOSCOPY (EGD) WITH PROPOFOL;  Surgeon: Lollie Sails, MD;  Location: [facility] ENDOSCOPY;  Service: Endoscopy;  Laterality: N/A;    Prior to Admission medications   Medication Sig Start Date End Date Taking? Authorizing Provider  acetaminophen (TYLENOL) 650 MG CR tablet Take 1,300 mg by mouth 2 (two) times daily as needed for pain.    [provider]  amantadine (SYMMETREL) 100 MG capsule Take 100 mg by mouth 2 (two) times daily. 03/23/15   [provider]  aspirin 325 MG tablet Take 325 mg by mouth daily.    [provider]  carbidopa-levodopa (SINEMET) 25-100 MG tablet Take 2 tablets by mouth 4 (four) times daily.  03/02/15   [provider]  lubiprostone (AMITIZA) 24 MCG capsule Take 24 mcg by mouth daily with breakfast.    [provider]  meloxicam (MOBIC) 7.5 MG tablet Take 7.5 mg  by mouth daily.    [provider]  mirtazapine (REMERON) 7.5 MG tablet Take 7.5 mg by mouth at bedtime.    [provider]  pravastatin (PRAVACHOL) 40 MG tablet Take 40 mg by mouth at bedtime.    [provider]  Riboflavin (VITAMIN B-2) 50 MG TABS Take 50 mg by mouth daily.    [provider]  rOPINIRole (REQUIP) 0.5 MG tablet Take 0.5 mg by mouth daily.    [provider]  Vitamin D, Ergocalciferol, (DRISDOL) 50000 units CAPS capsule Take 50,000 Units by mouth every Wednesday. 04/18/15   [provider]     Allergies Patient has no known allergies.  No family history on file.  Social History Social History   Tobacco Use  . Smoking status: Never Smoker  . Smokeless tobacco: Never Used  Substance Use Topics  . Alcohol use: Yes  . Drug use: No    Review of Systems limited by dementia  Constitutional: No reports of fevers Eyes: No visual changes.  ENT: No neck pain Cardiovascular: Denies chest wall pain Respiratory: Denies difficulty breathing Gastrointestinal: No vomiting Genitourinary: No groin injury Musculoskeletal: Negative for back pain.  No extremity injury Skin: Abrasion to the left forehead Neurological: Negative for new weakness   ____________________________________________   PHYSICAL EXAM:  VITAL SIGNS: ED Triage Vitals  Enc Vitals Group     BP 09/23/17 1302 134/85     Pulse Rate 09/23/17 1302 83     Resp 09/23/17 1302 16  Temp 09/23/17 1302 98.1 F (36.7 C)     Temp Source 09/23/17 1302 Oral     SpO2 09/23/17 1302 99 %     Weight 09/23/17 1306 36.7 kg (81 lb)     Height 09/23/17 1306 1.575 m (5\' 2" )     Head Circumference --      Peak Flow --      Pain Score 09/23/17 1305 0     Pain Loc --      Pain Edu? --      Excl. in [place]? --     Constitutional: Alert and oriented. No acute distress. Pleasant and interactive Head: Small ecchymosis and abrasion to the left lateral forehead Nose: No  congestion/rhinnorhea. Mouth/Throat: Mucous membranes are moist.   Neck: No vertebral tenderness palpation, normal range of motion without pain Cardiovascular: Normal rate, regular rhythm. Grossly normal heart sounds.  Good peripheral circulation.  No chest wall tenderness to palpation Respiratory: Normal respiratory effort.  No retractions.  Gastrointestinal: Soft and nontender. No distention.    Musculoskeletal: Moves all extremities without pain full range of motion, warm and well perfused, back without vertebral tenderness to palpation, Neurologic:  Normal speech and language. No gross focal neurologic deficits are appreciated.  Skin:  Skin is warm, dry  Psychiatric: Mood and affect are normal. Speech and behavior are normal.  ____________________________________________   LABS (all labs ordered are listed, but only abnormal results are displayed)  Labs Reviewed - No data to display ____________________________________________  EKG  None ____________________________________________  RADIOLOGY  CT head and max/face are reassuring ____________________________________________   PROCEDURES  Procedure(s) performed: No  Procedures   Critical Care performed: No ____________________________________________   INITIAL IMPRESSION / ASSESSMENT AND PLAN / ED COURSE  Pertinent labs & imaging results that were available during my care of the patient were reviewed by me and considered in my medical decision making (see chart for details).  Patient presents with a mechanical fall with head injury.  We will obtain CT head and maxillofacial scan  CT scans are reassuring, patient is at her baseline, appropriate for discharge at this time    ____________________________________________   FINAL CLINICAL IMPRESSION(S) / ED DIAGNOSES  Final diagnoses:  Injury of head, initial encounter  Fall, initial encounter        Note:  This document was prepared using Dragon voice  recognition software and may include unintentional dictation errors.    Lavonia Drafts, MD 09/23/17 [phone]

## 2017-09-23 NOTE — ED Notes (Signed)
Pt has hematoma noted to right side of forehead. Pt also noted to have skin tear that is bandaged on left elbow.

## 2017-09-26 ENCOUNTER — Other Ambulatory Visit: Payer: Self-pay | Admitting: *Deleted

## 2017-09-26 NOTE — Patient Outreach (Signed)
[place] [facility]) Care Management  09/26/2017  LYRICAL SOWLE 1945/05/19 [id number]   Telephone Screen  Referral Date:09/19/2017 Referral Source:[facility] ED Census Reason for Referral:6 or more ED visits in the past 6 months Insurance:[employer]   Outreach Attempt:  Outreach attempt #3 to patient for telephone screening.  Patient answered and confirmed HIPAA.  Requested RN Health Coach speak with husband.  [facility] services reviewed and discussed with patient and husband.  Husband states patient currently receives home health aide about every other day to assist with bathing and meals.  Feels patient is receiving enough assistance and declines [facility] services and screening process at this time.  Agreeable to have [facility] pamphlet mailed to review.  Encouraged to contact [facility] in the future if they feel patient will benefit from services.  Plan:  RN Health Coach will mail out Successful Outreach Letter with [place].  RN Health Coach will close case based on patient and husband declining services at this time.  [place] [phone] [email or web address]

## 2017-10-02 ENCOUNTER — Emergency Department: Payer: Medicare HMO

## 2017-10-02 ENCOUNTER — Observation Stay
Admission: EM | Admit: 2017-10-02 | Discharge: 2017-10-08 | Disposition: A | Discharge status: Home / Self Care | Payer: Medicare HMO | Attending: Internal Medicine | Admitting: Internal Medicine

## 2017-10-02 ENCOUNTER — Other Ambulatory Visit: Payer: Self-pay

## 2017-10-02 DIAGNOSIS — G20 Parkinson's disease: Secondary | ICD-10-CM | POA: Diagnosis not present

## 2017-10-02 DIAGNOSIS — E43 Unspecified severe protein-calorie malnutrition: Secondary | ICD-10-CM

## 2017-10-02 DIAGNOSIS — Z7982 Long term (current) use of aspirin: Secondary | ICD-10-CM | POA: Diagnosis not present

## 2017-10-02 DIAGNOSIS — R2681 Unsteadiness on feet: Secondary | ICD-10-CM | POA: Diagnosis not present

## 2017-10-02 DIAGNOSIS — Z79899 Other long term (current) drug therapy: Secondary | ICD-10-CM | POA: Diagnosis not present

## 2017-10-02 DIAGNOSIS — I10 Essential (primary) hypertension: Secondary | ICD-10-CM | POA: Diagnosis not present

## 2017-10-02 DIAGNOSIS — M1990 Unspecified osteoarthritis, unspecified site: Secondary | ICD-10-CM | POA: Diagnosis not present

## 2017-10-02 DIAGNOSIS — Z66 Do not resuscitate: Secondary | ICD-10-CM | POA: Insufficient documentation

## 2017-10-02 DIAGNOSIS — I959 Hypotension, unspecified: Secondary | ICD-10-CM

## 2017-10-02 DIAGNOSIS — F0280 Dementia in other diseases classified elsewhere without behavioral disturbance: Secondary | ICD-10-CM | POA: Diagnosis not present

## 2017-10-02 DIAGNOSIS — I951 Orthostatic hypotension: Secondary | ICD-10-CM | POA: Diagnosis not present

## 2017-10-02 DIAGNOSIS — R55 Syncope and collapse: Secondary | ICD-10-CM | POA: Diagnosis present

## 2017-10-02 LAB — CBC WITH DIFFERENTIAL/PLATELET
BASOS PCT: 0 %
Basophils Absolute: 0 10*3/uL (ref 0–0.1)
Eosinophils Absolute: 0 10*3/uL (ref 0–0.7)
Eosinophils Relative: 0 %
HEMATOCRIT: 37.7 % (ref 35.0–47.0)
HEMOGLOBIN: 13.1 g/dL (ref 12.0–16.0)
LYMPHS ABS: 0.9 10*3/uL — AB (ref 1.0–3.6)
Lymphocytes Relative: 9 %
MCH: 33.1 pg (ref 26.0–34.0)
MCHC: 34.7 g/dL (ref 32.0–36.0)
MCV: 95.6 fL (ref 80.0–100.0)
MONO ABS: 0.8 10*3/uL (ref 0.2–0.9)
Monocytes Relative: 8 %
NEUTROS ABS: 7.8 10*3/uL — AB (ref 1.4–6.5)
NEUTROS PCT: 83 %
Platelets: 245 10*3/uL (ref 150–440)
RBC: 3.94 MIL/uL (ref 3.80–5.20)
RDW: 13.2 % (ref 11.5–14.5)
WBC: 9.4 10*3/uL (ref 3.6–11.0)

## 2017-10-02 LAB — COMPREHENSIVE METABOLIC PANEL
ALBUMIN: 4.3 g/dL (ref 3.5–5.0)
ALK PHOS: 62 U/L (ref 38–126)
ALT: <5 U/L (ref 0–44)
ANION GAP: 8 (ref 5–15)
AST: 31 U/L (ref 15–41)
BUN: 22 mg/dL (ref 8–23)
CO2: 26 mmol/L (ref 22–32)
Calcium: 9.4 mg/dL (ref 8.9–10.3)
Chloride: 107 mmol/L (ref 98–111)
Creatinine, Ser: 0.83 mg/dL (ref 0.44–1.00)
GFR calc Af Amer: >60 mL/min (ref >60)
GFR calc non Af Amer: >60 mL/min (ref >60)
GLUCOSE: 95 mg/dL (ref 70–99)
POTASSIUM: 4.2 mmol/L (ref 3.5–5.1)
SODIUM: 141 mmol/L (ref 135–145)
Total Bilirubin: 1.1 mg/dL (ref 0.3–1.2)
Total Protein: 7 g/dL (ref 6.5–8.1)

## 2017-10-02 LAB — GLUCOSE, CAPILLARY: Glucose-Capillary: 97 mg/dL (ref 70–99)

## 2017-10-02 LAB — TROPONIN I: Troponin I: <0.03 ng/mL (ref <0.03)

## 2017-10-02 MED ORDER — ASPIRIN EC 325 MG PO TBEC
325.0000 mg | DELAYED_RELEASE_TABLET | Freq: Every day | ORAL | Status: DC
  Administered 2017-10-02 – 2017-10-08 (×6): 325 mg via ORAL
  Filled 2017-10-02 (×6): qty 1

## 2017-10-02 MED ORDER — VITAMIN D (ERGOCALCIFEROL) 1.25 MG (50000 UNIT) PO CAPS
50000.0000 [IU] | ORAL_CAPSULE | ORAL | Status: DC
  Administered 2017-10-08: 50000 [IU] via ORAL
  Filled 2017-10-02: qty 1

## 2017-10-02 MED ORDER — SENNOSIDES-DOCUSATE SODIUM 8.6-50 MG PO TABS: 1.0000 | ORAL_TABLET | Freq: Every evening | ORAL | Status: DC | PRN

## 2017-10-02 MED ORDER — AMANTADINE HCL 100 MG PO CAPS
100.0000 mg | ORAL_CAPSULE | Freq: Two times a day (BID) | ORAL | Status: DC
  Administered 2017-10-02 – 2017-10-08 (×11): 100 mg via ORAL
  Filled 2017-10-02 (×13): qty 1

## 2017-10-02 MED ORDER — ONDANSETRON HCL 4 MG PO TABS: 4.0000 mg | ORAL_TABLET | Freq: Four times a day (QID) | ORAL | Status: DC | PRN

## 2017-10-02 MED ORDER — LUBIPROSTONE 24 MCG PO CAPS
24.0000 ug | ORAL_CAPSULE | Freq: Every day | ORAL | Status: DC
  Administered 2017-10-03 – 2017-10-08 (×5): 24 ug via ORAL
  Filled 2017-10-02 (×6): qty 1

## 2017-10-02 MED ORDER — ACETAMINOPHEN 325 MG PO TABS: 650.0000 mg | ORAL_TABLET | Freq: Four times a day (QID) | ORAL | Status: DC | PRN

## 2017-10-02 MED ORDER — ACETAMINOPHEN 650 MG RE SUPP: 650.0000 mg | Freq: Four times a day (QID) | RECTAL | Status: DC | PRN

## 2017-10-02 MED ORDER — SODIUM CHLORIDE 0.9% FLUSH
3.0000 mL | Freq: Two times a day (BID) | INTRAVENOUS | Status: DC
  Administered 2017-10-02 – 2017-10-06 (×6): 3 mL via INTRAVENOUS

## 2017-10-02 MED ORDER — CARBIDOPA-LEVODOPA 25-100 MG PO TABS
2.0000 | ORAL_TABLET | Freq: Four times a day (QID) | ORAL | Status: DC
  Administered 2017-10-02 – 2017-10-08 (×17): 2 via ORAL
  Filled 2017-10-02 (×26): qty 2

## 2017-10-02 MED ORDER — ROPINIROLE HCL 1 MG PO TABS
0.5000 mg | ORAL_TABLET | Freq: Every day | ORAL | Status: DC
  Administered 2017-10-02 – 2017-10-08 (×6): 0.5 mg via ORAL
  Filled 2017-10-02 (×6): qty 1

## 2017-10-02 MED ORDER — SODIUM CHLORIDE 0.9 % IV SOLN
INTRAVENOUS | Status: DC
  Administered 2017-10-02 – 2017-10-07 (×4): via INTRAVENOUS

## 2017-10-02 MED ORDER — MELOXICAM 7.5 MG PO TABS
7.5000 mg | ORAL_TABLET | Freq: Every day | ORAL | Status: DC
  Administered 2017-10-02 – 2017-10-08 (×6): 7.5 mg via ORAL
  Filled 2017-10-02 (×6): qty 1

## 2017-10-02 MED ORDER — PRAVASTATIN SODIUM 40 MG PO TABS
40.0000 mg | ORAL_TABLET | Freq: Every day | ORAL | Status: DC
  Administered 2017-10-02 – 2017-10-07 (×6): 40 mg via ORAL
  Filled 2017-10-02 (×6): qty 1

## 2017-10-02 MED ORDER — ENOXAPARIN SODIUM 30 MG/0.3ML ~~LOC~~ SOLN
30.0000 mg | SUBCUTANEOUS | Status: DC
  Administered 2017-10-02 – 2017-10-07 (×5): 30 mg via SUBCUTANEOUS
  Filled 2017-10-02 (×6): qty 0.3

## 2017-10-02 MED ORDER — ADULT MULTIVITAMIN W/MINERALS CH
1.0000 | ORAL_TABLET | Freq: Every day | ORAL | Status: DC
  Administered 2017-10-03 – 2017-10-08 (×4): 1 via ORAL
  Filled 2017-10-02 (×4): qty 1

## 2017-10-02 MED ORDER — MIRTAZAPINE 15 MG PO TABS
7.5000 mg | ORAL_TABLET | Freq: Every day | ORAL | Status: DC
  Administered 2017-10-02 – 2017-10-07 (×6): 7.5 mg via ORAL
  Filled 2017-10-02 (×6): qty 1

## 2017-10-02 MED ORDER — ONDANSETRON HCL 4 MG/2ML IJ SOLN: 4.0000 mg | Freq: Four times a day (QID) | INTRAMUSCULAR | Status: DC | PRN

## 2017-10-02 NOTE — ED Notes (Signed)
Pillow provided to pt

## 2017-10-02 NOTE — ED Notes (Signed)
Pt provided with warm blankets.

## 2017-10-02 NOTE — ED Triage Notes (Addendum)
Pt found at home in kitchen floor by husband. Pt seen for same on 8/6. Unknown LOC. Per family pt normally walks with a walker and holds a conversation. Vital signs with EMS as follows 117/67 97% RA 78 HR 97.8 oral 100 glucose.

## 2017-10-02 NOTE — Progress Notes (Signed)
PHARMACIST - PHYSICIAN COMMUNICATION  CONCERNING:  Enoxaparin (Lovenox) for DVT Prophylaxis    RECOMMENDATION: Patient was prescribed enoxaprin 40mg  q24 hours for VTE prophylaxis.   Filed Weights   10/02/17 1233  Weight: 85 lb (38.6 kg)    Body mass index is 15.06 kg/m.  Estimated Creatinine Clearance: 36.2 mL/min (by C-G formula based on SCr of 0.83 mg/dL).   Patient is candidate for enoxaparin 30mg  every 24 hours based  Weight less then 45kg for female   DESCRIPTION: Pharmacy has adjusted enoxaparin dose.  Patient is now receiving enoxaparin 30mg  every 24 hours.  Pernell Dupre, PharmD, BCPS Clinical Pharmacist 10/02/2017 5:15 PM

## 2017-10-02 NOTE — H&P (Signed)
[place] [facility] [place] NAME: Jillian Vaughn    MR#:  [id number]  DATE OF BIRTH:  July 24, 1945  DATE OF ADMISSION:  10/02/2017  PRIMARY CARE PHYSICIAN: Lenard Simmer, MD   REQUESTING/REFERRING PHYSICIAN:   CHIEF COMPLAINT:   Chief Complaint  Patient presents with  . Fall    HISTORY OF PRESENT ILLNESS: Jillian Vaughn  is a 72 y.o. female with a known history of Parkinson's disease, dementia and arthritis passed out around 11:30 AM at home.  No history of any witnessed seizure.  Patient's husband called EMS and she was brought to the emergency room.  Patient has dementia and is unable to give much history.  She is awake and responds to some verbal commands.  Moves all extremities.  First set of troponin is negative CT head no acute abnormality.  PAST MEDICAL HISTORY:   Past Medical History:  Diagnosis Date  . Arthritis   . Dementia   . Parkinson disease ([place])     PAST SURGICAL HISTORY:  Past Surgical History:  Procedure Laterality Date  . APPENDECTOMY    . ESOPHAGOGASTRODUODENOSCOPY (EGD) WITH PROPOFOL N/A 06/19/2015   Procedure: ESOPHAGOGASTRODUODENOSCOPY (EGD) WITH PROPOFOL;  Surgeon: Lollie Sails, MD;  Location: [facility] ENDOSCOPY;  Service: Endoscopy;  Laterality: N/A;    SOCIAL HISTORY:  Social History   Tobacco Use  . Smoking status: Never Smoker  . Smokeless tobacco: Never Used  Substance Use Topics  . Alcohol use: Yes    FAMILY HISTORY:  Father and mother deceased No history of COPD diabetes cancer in the family  DRUG ALLERGIES: No Known Allergies  REVIEW OF SYSTEMS:  Could not be completely obtain secondary to dementia MEDICATIONS AT HOME:  Prior to Admission medications   Medication Sig Start Date End Date Taking? Authorizing Provider  acetaminophen (TYLENOL) 650 MG CR tablet Take 1,300 mg by mouth 2 (two) times daily as needed for pain.   Yes [provider]  amantadine (SYMMETREL) 100 MG capsule  Take 100 mg by mouth 2 (two) times daily. 03/23/15  Yes [provider]  aspirin 325 MG tablet Take 325 mg by mouth daily.   Yes [provider]  carbidopa-levodopa (SINEMET) 25-100 MG tablet Take 2 tablets by mouth 4 (four) times daily.  03/02/15  Yes [provider]  lubiprostone (AMITIZA) 24 MCG capsule Take 24 mcg by mouth daily with breakfast.   Yes [provider]  meloxicam (MOBIC) 7.5 MG tablet Take 7.5 mg by mouth daily.   Yes [provider]  mirtazapine (REMERON) 7.5 MG tablet Take 7.5 mg by mouth at bedtime.   Yes [provider]  pravastatin (PRAVACHOL) 40 MG tablet Take 40 mg by mouth at bedtime.   Yes [provider]  Riboflavin (VITAMIN B-2) 50 MG TABS Take 50 mg by mouth daily.   Yes [provider]  rOPINIRole (REQUIP) 0.5 MG tablet Take 0.5 mg by mouth daily.   Yes [provider]  Vitamin D, Ergocalciferol, (DRISDOL) 50000 units CAPS capsule Take 50,000 Units by mouth every Wednesday. 04/18/15  Yes [provider]      PHYSICAL EXAMINATION:   VITAL SIGNS: Blood pressure (!) 156/88, pulse 78, temperature 97.9 F (36.6 C), temperature source Oral, resp. rate 18, height 5\' 3"  (1.6 m), weight 38.6 kg, SpO2 99 %.  GENERAL:  72 y.o.-year-old patient lying in the bed with no acute distress.  EYES: Pupils equal, round, reactive to light and accommodation.  No scleral icterus. Extraocular muscles intact.  HEENT: Head atraumatic, normocephalic. Oropharynx and nasopharynx clear.  NECK:  Supple, no jugular venous distention. No thyroid enlargement, no tenderness.  LUNGS: Normal breath sounds bilaterally, no wheezing, rales,rhonchi or crepitation. No use of accessory muscles of respiration.  CARDIOVASCULAR: S1, S2 normal. No murmurs, rubs, or gallops.  ABDOMEN: Soft, nontender, nondistended. Bowel sounds present. No organomegaly or mass.  EXTREMITIES: No pedal edema, cyanosis, or clubbing.   NEUROLOGIC: Cranial nerves II through XII are intact. Muscle strength 5/5 in all extremities. Sensation intact. Gait not checked.  Has dementia PSYCHIATRIC: Mood pleasant SKIN: No obvious rash, lesion, or ulcer.   LABORATORY PANEL:   CBC Recent Labs  Lab 10/02/17 1249  WBC 9.4  HGB 13.1  HCT 37.7  PLT 245  MCV 95.6  MCH 33.1  MCHC 34.7  RDW 13.2  LYMPHSABS 0.9*  MONOABS 0.8  EOSABS 0.0  BASOSABS 0.0   ------------------------------------------------------------------------------------------------------------------  Chemistries  Recent Labs  Lab 10/02/17 1249  NA 141  K 4.2  CL 107  CO2 26  GLUCOSE 95  BUN 22  CREATININE 0.83  CALCIUM 9.4  AST 31  ALT <5  ALKPHOS 62  BILITOT 1.1   ------------------------------------------------------------------------------------------------------------------ estimated creatinine clearance is 36.2 mL/min (by C-G formula based on SCr of 0.83 mg/dL). ------------------------------------------------------------------------------------------------------------------ No results for input(s): TSH, T4TOTAL, T3FREE, THYROIDAB in the last 72 hours.  Invalid input(s): FREET3   Coagulation profile No results for input(s): INR, PROTIME in the last 168 hours. ------------------------------------------------------------------------------------------------------------------- No results for input(s): DDIMER in the last 72 hours. -------------------------------------------------------------------------------------------------------------------  Cardiac Enzymes Recent Labs  Lab 10/02/17 1249  TROPONINI <0.03   ------------------------------------------------------------------------------------------------------------------ Invalid input(s): POCBNP  ---------------------------------------------------------------------------------------------------------------  Urinalysis    Component Value Date/Time   COLORURINE STRAW (A)  09/17/2017 2000   APPEARANCEUR CLEAR (A) 09/17/2017 2000   LABSPEC 1.009 09/17/2017 2000   PHURINE 7.0 09/17/2017 2000   GLUCOSEU NEGATIVE 09/17/2017 2000   HGBUR NEGATIVE 09/17/2017 2000   BILIRUBINUR NEGATIVE 09/17/2017 2000   KETONESUR 5 (A) 09/17/2017 2000   PROTEINUR NEGATIVE 09/17/2017 2000   NITRITE NEGATIVE 09/17/2017 2000   LEUKOCYTESUR NEGATIVE 09/17/2017 2000     RADIOLOGY: Ct Head Wo Contrast  Result Date: 10/02/2017 CLINICAL DATA:  Unwitnessed fall. History of Parkinson's disease and dementia EXAM: CT HEAD WITHOUT CONTRAST TECHNIQUE: Contiguous axial images were obtained from the base of the skull through the vertex without intravenous contrast. COMPARISON:  September 23, 2017 FINDINGS: Brain: There is age related volume loss. There is no intracranial mass, hemorrhage, extra-axial fluid collection, or midline shift. Gray-white compartments appear normal except for minimal periventricular small vessel disease. No evident acute infarct. Vascular: No hyperdense vessel. There are foci of calcification in each carotid siphon. Skull: Bony calvarium appears intact. Sinuses/Orbits: There is mucosal thickening in several ethmoid air cells. Other visualized paranasal sinuses are clear. Orbits appear symmetric bilaterally except for previous cataract removal on the right. Other: Mastoid air cells are clear. There is debris in each external auditory canal. IMPRESSION: Age related volume loss with minimal periventricular small vessel disease. No acute infarct. No mass or hemorrhage. Foci of arterial vascular calcification noted. There is mucosal thickening in several ethmoid air cells. There is probable cerumen in each external auditory canal. Electronically Signed   By: Lowella Grip III M.D.   On: 10/02/2017 14:35   Dg Chest Portable 1 View  Result Date: 10/02/2017 CLINICAL DATA:  Found at home on the floor. EXAM: PORTABLE CHEST 1 VIEW COMPARISON:  06/19/2017  FINDINGS: Heart size is normal.  Chronic aortic atherosclerosis. The lungs are clear. The vascularity is normal. No effusions. Bilateral nipple shadows. No acute bone finding. IMPRESSION: No active cardiopulmonary disease.  Aortic atherosclerosis. Electronically Signed   By: Nelson Chimes M.D.   On: 10/02/2017 14:49    EKG: Orders placed or performed during the hospital encounter of 09/17/17  . EKG 12-Lead  . EKG 12-Lead  . ED EKG  . ED EKG    IMPRESSION AND PLAN:  72 year old female patient with history of dementia, Parkinson's disease, arthritis presented to emergency room after passing out  -Syncope and collapse Admit patient to telemetry observation bed Check echocardiogram and carotid ultrasound Cycle troponin to rule out ischemia Telemetry monitoring Resume aspirin  -Dehydration IV fluids  -Advanced dementia Supportive care  -Parkinson's disease Supportive care Resume Sinemet and amantadine  -DVT prophylaxis subcu Lovenox daily  All the records are reviewed and case discussed with ED provider. Management plans discussed with the patient, family and they are in agreement.  CODE STATUS:Full code    TOTAL TIME TAKING CARE OF THIS PATIENT: 52 minutes.    Saundra Shelling M.D on 10/02/2017 at 4:19 PM  Between 7am to 6pm - Pager - [phone]  After 6pm go to [email or web address] - password [email or web address] [facility]  Office  [phone]  CC: Primary care physician; Lenard Simmer, MD

## 2017-10-02 NOTE — Progress Notes (Signed)
Advanced care plan.  Purpose of the Encounter: CODE STATUS  Parties in Attendance: Patient and family  Patient's Decision Capacity: Has dementia  Subjective/Patient's story: Presented for passing out   Objective/Medical story Evaluation for syncope   Goals of care determination:  Advance care directives and goals of care discussed with patient's family Want everything done for now which includes CPR, intubation if need arises   CODE STATUS: Full code   Time spent discussing advanced care planning: 16 minutes

## 2017-10-02 NOTE — ED Notes (Addendum)
Tammy, RN given report.

## 2017-10-03 ENCOUNTER — Observation Stay: Payer: Medicare HMO

## 2017-10-03 ENCOUNTER — Observation Stay (HOSPITAL_BASED_OUTPATIENT_CLINIC_OR_DEPARTMENT_OTHER)
Admit: 2017-10-03 | Discharge: 2017-10-03 | Disposition: A | Discharge status: Home / Self Care | Payer: Medicare HMO | Attending: Internal Medicine | Admitting: Internal Medicine

## 2017-10-03 ENCOUNTER — Other Ambulatory Visit: Payer: Self-pay

## 2017-10-03 DIAGNOSIS — R55 Syncope and collapse: Secondary | ICD-10-CM

## 2017-10-03 LAB — BASIC METABOLIC PANEL
ANION GAP: 8 (ref 5–15)
BUN: 19 mg/dL (ref 8–23)
CALCIUM: 8.6 mg/dL — AB (ref 8.9–10.3)
CO2: 27 mmol/L (ref 22–32)
Chloride: 108 mmol/L (ref 98–111)
Creatinine, Ser: 0.85 mg/dL (ref 0.44–1.00)
GFR calc Af Amer: >60 mL/min (ref >60)
GFR calc non Af Amer: >60 mL/min (ref >60)
GLUCOSE: 83 mg/dL (ref 70–99)
Potassium: 3.6 mmol/L (ref 3.5–5.1)
Sodium: 143 mmol/L (ref 135–145)

## 2017-10-03 LAB — CBC
HCT: 36 % (ref 35.0–47.0)
HEMOGLOBIN: 12.5 g/dL (ref 12.0–16.0)
MCH: 33.1 pg (ref 26.0–34.0)
MCHC: 34.6 g/dL (ref 32.0–36.0)
MCV: 95.7 fL (ref 80.0–100.0)
Platelets: 221 10*3/uL (ref 150–440)
RBC: 3.77 MIL/uL — ABNORMAL LOW (ref 3.80–5.20)
RDW: 13.3 % (ref 11.5–14.5)
WBC: 5.5 10*3/uL (ref 3.6–11.0)

## 2017-10-03 LAB — TROPONIN I
Troponin I: <0.03 ng/mL (ref <0.03)
Troponin I: <0.03 ng/mL (ref <0.03)

## 2017-10-03 LAB — ECHOCARDIOGRAM COMPLETE
Height: 63 in
WEIGHTICAEL: 1232 [oz_av]

## 2017-10-03 LAB — CORTISOL: Cortisol, Plasma: 10.2 ug/dL

## 2017-10-03 LAB — GLUCOSE, CAPILLARY
GLUCOSE-CAPILLARY: 106 mg/dL — AB (ref 70–99)
Glucose-Capillary: 69 mg/dL — ABNORMAL LOW (ref 70–99)

## 2017-10-03 MED ORDER — SODIUM CHLORIDE 0.9 % IV BOLUS
1000.0000 mL | Freq: Once | INTRAVENOUS | Status: AC
  Administered 2017-10-03: 1000 mL via INTRAVENOUS

## 2017-10-03 MED ORDER — QUETIAPINE FUMARATE 25 MG PO TABS
25.0000 mg | ORAL_TABLET | Freq: Once | ORAL | Status: DC
  Filled 2017-10-03 (×2): qty 1

## 2017-10-03 MED ORDER — MIDODRINE HCL 5 MG PO TABS
10.0000 mg | ORAL_TABLET | Freq: Three times a day (TID) | ORAL | Status: DC
  Administered 2017-10-03 – 2017-10-04 (×2): 10 mg via ORAL
  Filled 2017-10-03 (×2): qty 2

## 2017-10-03 MED ORDER — ENSURE ENLIVE PO LIQD
237.0000 mL | Freq: Two times a day (BID) | ORAL | Status: DC
  Administered 2017-10-03 – 2017-10-08 (×7): 237 mL via ORAL

## 2017-10-03 NOTE — Progress Notes (Signed)
Patient had low BP 55/37 while sitting.  MD was notified and he ordered a bolus.  BP went up to 116/66.  Bolus is complete.  Patient is sleeping.  Phillis Knack, RN

## 2017-10-03 NOTE — Progress Notes (Signed)
PT Cancellation Note  Patient Details Name: Jillian Vaughn MRN: [id number] DOB: 1945-06-07   Cancelled Treatment:    Reason Eval/Treat Not Completed: Patient at procedure or test/unavailable.  Order received.  Chart reviewed.  Pt currently not in room.  Will re-attempt later if time allows.   Roxanne Gates, PT, DPT 10/03/2017, 8:54 AM

## 2017-10-03 NOTE — Care Management Obs Status (Signed)
[place] NOTIFICATION   Patient Details  Name: TELISHA ZAWADZKI MRN: [id number] Date of Birth: 1945-04-13   Medicare Observation Status Notification Given:  Yes    Beverly Sessions, RN 10/03/2017, 5:15 PM

## 2017-10-03 NOTE — Care Management (Addendum)
Patient admitted from home with syncope.  Lives at home with Husband.  Assessment completed via phone with daughter who is HPOA.  Daughter to fax copy of POA to the unit, and secretary to put on chart.  Patient with history of dementia.  Husband is primary caregiver, and provides transportation.  However he has become forgetful himself.  PCP [place].  Previously open with [facility] and daughter would like to use them again.  Has PCS services in the home through [facility] on Monday and Thursday from 11am-2pm.  PT has assessed patient and recommends home health PT.  Patient has a RW and BSC in the home.  Per daughter patient has and open APS case, and case worker assigned to her.   Daughter was requesting placement.  RNCM explained that patient would be qualify for placement from the hospital and it would be out of pocket.  Daughter states that it is not feasible at this time.  Daughter in agreement to set up home health services through [facility] for RN, PT, aide and SW.  We also discussed the potential for them to increase PCS services in the home if needed.  Medicare obs letter reviewed with daughter via phone and copy was faxed via e-mail to her.  Hard copy left at bedside.  Heads up to [place] with [facility] given a heads up referral.

## 2017-10-03 NOTE — Progress Notes (Signed)
Talked to Dr. Duane Boston about patient being agitated and impulsive tonight. Patient trying to get out of the bed, redirected multiple times. Patient is confused has history of dementia and parkinson's. Asked if we can have tele sitter for this patient, order given. Also order given for seroquel 25 mg p.o if patient is willing to take oral meds, she has been refusing to take medication. No other concern at the moment. RN will continue to monitor.

## 2017-10-03 NOTE — Evaluation (Signed)
Physical Therapy Evaluation Patient Details Name: Jillian Vaughn MRN: [id number] DOB: 13-Apr-1945 Today's Date: 10/03/2017   History of Present Illness  Patient is a 72 year old female admitted following syncope and collapse.  PMH includes Parkinson's Disease, dementia and arthritis.  Clinical Impression  Patient is a 72 year old female who lives in a one story home with her husband.  She is dependent with ADL's and ambulates with a RW and assistance from her husband at baseline.  Pt has a hx of dementia and can provide some social hx with husband assisting at times.  Pt is able to perform bed mobility mod I and sit at EOB without assistance for MMT.  Pt presented with overall decreased strength of UE and LE and reported mild pain in L knee during testing.  She required supervision for STS transfers as pt needs some VC's for body mechanics to remain safe.  Pt ambulated 40 ft in room with RW and assistance for navigation of obstacles and sequencing turns.  Pt demonstrated balance deficits and fall risk during functional mobility.  Pt will continue to benefit from skilled PT with focus on strength, safe functional mobility and balance and fall prevention.    Follow Up Recommendations Home health PT;Supervision for mobility/OOB    Equipment Recommendations  None recommended by PT    Recommendations for Other Services       Precautions / Restrictions Precautions Precautions: Fall Restrictions Weight Bearing Restrictions: No      Mobility  Bed Mobility Overal bed mobility: Modified Independent             General bed mobility comments: No assistance needed from PT to get to bedside; able to use bedrail with increased time.  Transfers Overall transfer level: Needs assistance Equipment used: Rolling walker (2 wheeled) Transfers: Sit to/from Stand Sit to Stand: Supervision         General transfer comment: Able to stand from bedside and toilet without assistance to lift.   Supervision needed due to pt being unsteady on feet.  Ambulation/Gait Ambulation/Gait assistance: Min assist Gait Distance (Feet): 50 Feet Assistive device: Rolling walker (2 wheeled)     Gait velocity interpretation: <1.8 ft/sec, indicate of risk for recurrent falls General Gait Details: Low foot clearance, very flexed/kyphotic posture, VC's needed for sequencing and assistance for navigating obstacles.  Stairs            Wheelchair Mobility    Modified Rankin (Stroke Patients Only)       Balance Overall balance assessment: Needs assistance Sitting-balance support: Bilateral upper extremity supported Sitting balance-Leahy Scale: Fair     Standing balance support: Bilateral upper extremity supported Standing balance-Leahy Scale: Poor                               Pertinent Vitals/Pain Pain Assessment: No/denies pain    Home Living Family/patient expects to be discharged to:: Private residence Living Arrangements: Spouse/significant other Available Help at Discharge: Family;Available 24 hours/day Type of Home: House Home Access: Stairs to enter Entrance Stairs-Rails: Can reach both Entrance Stairs-Number of Steps: 6 Home Layout: One level Home Equipment: Bedside commode      Prior Function Level of Independence: Needs assistance   Gait / Transfers Assistance Needed: Ambulates household distances only with assistance from husband.  ADL's / Homemaking Assistance Needed: Husband assists with bathing, dressing, etc.        Hand Dominance  Extremity/Trunk Assessment   Upper Extremity Assessment Upper Extremity Assessment: Generalized weakness    Lower Extremity Assessment Lower Extremity Assessment: Generalized weakness(Reports some L knee pain with mobility and resistance applied during knee flexion and extension.)    Cervical / Trunk Assessment Cervical / Trunk Assessment: Kyphotic(Presents with a scoliotic curve as well.)   Communication   Communication: No difficulties  Cognition Arousal/Alertness: Awake/alert Behavior During Therapy: WFL for tasks assessed/performed Overall Cognitive Status: Within Functional Limits for tasks assessed                                 General Comments: Follows commands consistently.      General Comments      Exercises Other Exercises Other Exercises: Guided pt through toilet transfer, offering VC's for body mechanics and assistance with equiptment. x 5 min Other Exercises: Educated pt concerning benefit of PT and what to expect during [facility] PT.  Answered all pertinent questions. x3 min   Assessment/Plan    PT Assessment Patient needs continued PT services  PT Problem List Decreased strength;Decreased mobility;Decreased balance;Decreased knowledge of use of DME       PT Treatment Interventions DME instruction;Therapeutic activities;Gait training;Therapeutic exercise;Patient/family education;Stair training;Balance training;Functional mobility training    PT Goals (Current goals can be found in the Care Plan section)  Acute Rehab PT Goals PT Goal Formulation: Patient unable to participate in goal setting    Frequency Min 2X/week   Barriers to discharge        Co-evaluation               AM-PAC PT "6 Clicks" Daily Activity  Outcome Measure Difficulty turning over in bed (including adjusting bedclothes, sheets and blankets)?: A Little Difficulty moving from lying on back to sitting on the side of the bed? : A Little Difficulty sitting down on and standing up from a chair with arms (e.g., wheelchair, bedside commode, etc,.)?: A Little Help needed moving to and from a bed to chair (including a wheelchair)?: A Little Help needed walking in hospital room?: A Little Help needed climbing 3-5 steps with a railing? : A Lot 6 Click Score: 17    End of Session Equipment Utilized During Treatment: Gait belt Activity Tolerance: Patient tolerated  treatment well Patient left: in chair;with chair alarm set;with family/visitor present;with call bell/phone within reach Nurse Communication: Mobility status PT Visit Diagnosis: Unsteadiness on feet (R26.81);Muscle weakness (generalized) (M62.81)    Time: 3419-3790 PT Time Calculation (min) (ACUTE ONLY): 22 min   Charges:   PT Evaluation $PT Eval Low Complexity: 1 Low PT Treatments $Therapeutic Activity: 8-22 mins        Roxanne Gates, PT, DPT   Roxanne Gates 10/03/2017, 11:10 AM

## 2017-10-03 NOTE — Progress Notes (Signed)
*  PRELIMINARY RESULTS* Echocardiogram 2D Echocardiogram has been performed.  Jillian Vaughn Jillian Vaughn 10/03/2017, 2:51 PM

## 2017-10-03 NOTE — Progress Notes (Signed)
[facility] at [facility]                                                                                                                                                                                  Patient Demographics   Jillian Vaughn, is a 72 y.o. female, DOB - 01-03-1946, PYK:[id number]  Admit date - 10/02/2017   Admitting Physician Saundra Shelling, MD  Outpatient Primary MD for the patient is Morayati, Jillian Sledge, MD   LOS - 0  Subjective: Patient's admitted with syncope and collapse.  Was noted to have orthostatic hypotension was given IV fluids.  Was seen by PT patient had a episode where when she stood up her blood pressure dropped in the 70s.  And became unresponsive.    Review of Systems:   CONSTITUTIONAL: No documented fever. No fatigue, weakness. No weight gain, no weight loss.  EYES: No blurry or double vision.  ENT: No tinnitus. No postnasal drip. No redness of the oropharynx.  RESPIRATORY: No cough, no wheeze, no hemoptysis. No dyspnea.  CARDIOVASCULAR: No chest pain. No orthopnea. No palpitations. No syncope.  GASTROINTESTINAL: No nausea, no vomiting or diarrhea. No abdominal pain. No melena or hematochezia.  GENITOURINARY: No dysuria or hematuria.  ENDOCRINE: No polyuria or nocturia. No heat or cold intolerance.  HEMATOLOGY: No anemia. No bruising. No bleeding.  INTEGUMENTARY: No rashes. No lesions.  MUSCULOSKELETAL: No arthritis. No swelling. No gout.  NEUROLOGIC: No numbness, tingling, or ataxia. No seizure-type activity.  PSYCHIATRIC: No anxiety. No insomnia. No ADD.    Vitals:   Vitals:   10/03/17 0319 10/03/17 1224 10/03/17 1231 10/03/17 1349  BP: (!) 157/91 92/79 116/66 125/75  Pulse: 79 88 87 92  Resp: (!) 22 16 16 18   Temp: (!) 97.5 F (36.4 C)     TempSrc:      SpO2: 100% 99% 98%   Weight: 34.9 kg     Height:        Wt Readings from Last 3 Encounters:  10/03/17 34.9 kg  09/23/17 36.7 kg  09/17/17 36.8 kg      Intake/Output Summary (Last 24 hours) at 10/03/2017 1453 Last data filed at 10/03/2017 0027 Gross per 24 hour  Intake 487.5 ml  Output -  Net 487.5 ml    Physical Exam:   GENERAL: Pleasant-appearing in no apparent distress.  HEAD, EYES, EARS, NOSE AND THROAT: Atraumatic, normocephalic. Extraocular muscles are intact. Pupils equal and reactive to light. Sclerae anicteric. No conjunctival injection. No oro-pharyngeal erythema.  NECK: Supple. There is no jugular venous distention. No bruits, no lymphadenopathy, no thyromegaly.  HEART: Regular rate and rhythm,. No murmurs, no rubs, no clicks.  LUNGS: Clear to auscultation bilaterally. No rales or rhonchi. No wheezes.  ABDOMEN: Soft, flat, nontender, nondistended. Has good bowel sounds. No hepatosplenomegaly appreciated.  EXTREMITIES: No evidence of any cyanosis, clubbing, or peripheral edema.  +2 pedal and radial pulses bilaterally.  NEUROLOGIC: The patient is alert, awake, and oriented x3 with no focal motor or sensory deficits appreciated bilaterally.  SKIN: Moist and warm with no rashes appreciated.  Psych: Not anxious, depressed LN: No inguinal LN enlargement    Antibiotics   Anti-infectives (From admission, onward)   None      Medications   Scheduled Meds: . amantadine  100 mg Oral BID  . aspirin EC  325 mg Oral Daily  . carbidopa-levodopa  2 tablet Oral QID  . enoxaparin (LOVENOX) injection  30 mg Subcutaneous Q24H  . feeding supplement (ENSURE ENLIVE)  237 mL Oral BID BM  . lubiprostone  24 mcg Oral Q breakfast  . meloxicam  7.5 mg Oral Daily  . midodrine  10 mg Oral TID WC  . mirtazapine  7.5 mg Oral QHS  . multivitamin with minerals  1 tablet Oral Daily  . pravastatin  40 mg Oral QHS  . rOPINIRole  0.5 mg Oral Daily  . sodium chloride flush  3 mL Intravenous Q12H  . [START ON 10/08/2017] Vitamin D (Ergocalciferol)  50,000 Units Oral Q Wed   Continuous Infusions: . sodium chloride 75 mL/hr at 10/02/17 1757    PRN Meds:.acetaminophen **OR** acetaminophen, ondansetron **OR** ondansetron (ZOFRAN) IV, senna-docusate   Data Review:   Micro Results No results found for this or any previous visit (from the past 240 hour(s)).  Radiology Reports Ct Head Wo Contrast  Result Date: 10/02/2017 CLINICAL DATA:  Unwitnessed fall. History of Parkinson's disease and dementia EXAM: CT HEAD WITHOUT CONTRAST TECHNIQUE: Contiguous axial images were obtained from the base of the skull through the vertex without intravenous contrast. COMPARISON:  September 23, 2017 FINDINGS: Brain: There is age related volume loss. There is no intracranial mass, hemorrhage, extra-axial fluid collection, or midline shift. Gray-white compartments appear normal except for minimal periventricular small vessel disease. No evident acute infarct. Vascular: No hyperdense vessel. There are foci of calcification in each carotid siphon. Skull: Bony calvarium appears intact. Sinuses/Orbits: There is mucosal thickening in several ethmoid air cells. Other visualized paranasal sinuses are clear. Orbits appear symmetric bilaterally except for previous cataract removal on the right. Other: Mastoid air cells are clear. There is debris in each external auditory canal. IMPRESSION: Age related volume loss with minimal periventricular small vessel disease. No acute infarct. No mass or hemorrhage. Foci of arterial vascular calcification noted. There is mucosal thickening in several ethmoid air cells. There is probable cerumen in each external auditory canal. Electronically Signed   By: Lowella Grip III M.D.   On: 10/02/2017 14:35   Ct Head Wo Contrast  Result Date: 09/23/2017 CLINICAL DATA:  72 year old female tripped on rug and fell hitting head on tile floor. Was unconscious for short period of time. Initial encounter. EXAM: CT HEAD WITHOUT CONTRAST CT MAXILLOFACIAL WITHOUT CONTRAST TECHNIQUE: Multidetector CT imaging of the head and maxillofacial structures were  performed using the standard protocol without intravenous contrast. Multiplanar CT image reconstructions of the maxillofacial structures were also generated. COMPARISON:  09/17/2017. FINDINGS: CT HEAD FINDINGS Brain: No intracranial hemorrhage or CT evidence of large acute infarct. No intracranial mass lesion noted on this unenhanced exam. Mild atrophy. Prominent perivascular space versus remote lacunar infarct right lenticular nucleus. Vascular: Vascular calcifications Skull: No  skull fracture Other: Negative CT MAXILLOFACIAL FINDINGS Osseous: No facial fracture. Orbits: No orbital floor fracture or primary orbital abnormality noted. Sinuses: Clear. Soft tissues: No worrisome soft tissue abnormality. Other: Cervical spondylotic changes C5-6 and C6-7. Visualized aspect of the cervical spine without fracture identified. IMPRESSION: 1. No skull fracture or intracranial hemorrhage. 2. No facial or orbital fracture. 3. Mild atrophy. Electronically Signed   By: Genia Del M.D.   On: 09/23/2017 14:34   Ct Head Wo Contrast  Result Date: 09/17/2017 CLINICAL DATA:  [place] in bathroom hit head on toilet EXAM: CT HEAD WITHOUT CONTRAST CT CERVICAL SPINE WITHOUT CONTRAST TECHNIQUE: Multidetector CT imaging of the head and cervical spine was performed following the standard protocol without intravenous contrast. Multiplanar CT image reconstructions of the cervical spine were also generated. COMPARISON:  CT 07/26/2017, 06/19/2017 FINDINGS: CT HEAD FINDINGS Brain: No acute territorial infarction, hemorrhage or intracranial mass is visualized. 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 Mild atrophy. Stable ventricle size. Vascular: No hyperdense vessels. Scattered calcification at the carotid siphon Skull: Normal. Negative for fracture or focal lesion. Sinuses/Orbits: No acute finding. Other: None CT CERVICAL SPINE FINDINGS Alignment: No subluxation.  Facet alignment within normal limits Skull base and vertebrae: No  acute fracture. No primary bone lesion or focal pathologic process. Soft tissues and spinal canal: No prevertebral fluid or swelling. No visible canal hematoma. Disc levels: Moderate degenerative changes at C5-C6 and mild to moderate degenerative changes at C6-C7. Upper chest: Negative. Other: None IMPRESSION: 1. No CT evidence for acute intracranial abnormality.  Mild atrophy. 2. Degenerative changes at C5-C6 and C6-C7. No acute osseous abnormality. Electronically Signed   By: Donavan Foil M.D.   On: 09/17/2017 19:39   Ct Cervical Spine Wo Contrast  Result Date: 09/17/2017 CLINICAL DATA:  [place] in bathroom hit head on toilet EXAM: CT HEAD WITHOUT CONTRAST CT CERVICAL SPINE WITHOUT CONTRAST TECHNIQUE: Multidetector CT imaging of the head and cervical spine was performed following the standard protocol without intravenous contrast. Multiplanar CT image reconstructions of the cervical spine were also generated. COMPARISON:  CT 07/26/2017, 06/19/2017 FINDINGS: CT HEAD FINDINGS Brain: No acute territorial infarction, hemorrhage or intracranial mass is visualized. 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 Mild atrophy. Stable ventricle size. Vascular: No hyperdense vessels. Scattered calcification at the carotid siphon Skull: Normal. Negative for fracture or focal lesion. Sinuses/Orbits: No acute finding. Other: None CT CERVICAL SPINE FINDINGS Alignment: No subluxation.  Facet alignment within normal limits Skull base and vertebrae: No acute fracture. No primary bone lesion or focal pathologic process. Soft tissues and spinal canal: No prevertebral fluid or swelling. No visible canal hematoma. Disc levels: Moderate degenerative changes at C5-C6 and mild to moderate degenerative changes at C6-C7. Upper chest: Negative. Other: None IMPRESSION: 1. No CT evidence for acute intracranial abnormality.  Mild atrophy. 2. Degenerative changes at C5-C6 and C6-C7. No acute osseous abnormality. Electronically Signed    By: Donavan Foil M.D.   On: 09/17/2017 19:39   US Carotid Bilateral  Result Date: 10/03/2017 CLINICAL DATA:  Syncope and collapse.  History of hypertension. EXAM: BILATERAL CAROTID DUPLEX ULTRASOUND TECHNIQUE: Pearline Cables scale imaging, color Doppler and duplex ultrasound were performed of bilateral carotid and vertebral arteries in the neck. COMPARISON:  None. FINDINGS: Criteria: Quantification of carotid stenosis is based on velocity parameters that correlate the residual internal carotid diameter with NASCET-based stenosis levels, using the diameter of the distal internal carotid lumen as the denominator for stenosis measurement. The following velocity measurements  were obtained: RIGHT ICA:  96/34 cm/sec CCA:  26/83 cm/sec SYSTOLIC ICA/CCA RATIO:  1.3 ECA:  131 cm/sec LEFT ICA:  82/30 cm/sec CCA:  41/96 cm/sec SYSTOLIC ICA/CCA RATIO:  1.3 ECA:  73 cm/sec RIGHT CAROTID ARTERY: There is a minimal amount of echogenic plaque within the right carotid bulb (images 15 and 17), not resulting in elevated peak systolic velocities within the interrogated course the right internal carotid artery to suggest a hemodynamically significant stenosis. RIGHT VERTEBRAL ARTERY:  Antegrade flow LEFT CAROTID ARTERY: There is a minimal amount of echogenic plaque within the left carotid bulb (images 48 and 50), not resulting in elevated peak systolic velocities within the interrogated course the left internal carotid artery to suggest a hemodynamically significant stenosis. LEFT VERTEBRAL ARTERY:  Antegrade flow IMPRESSION: Minimal amount of bilateral atherosclerotic plaque, not resulting in a hemodynamically significant stenosis within either internal carotid artery. Electronically Signed   By: Sandi Mariscal M.D.   On: 10/03/2017 09:56   Dg Chest Portable 1 View  Result Date: 10/02/2017 CLINICAL DATA:  Found at home on the floor. EXAM: PORTABLE CHEST 1 VIEW COMPARISON:  06/19/2017 FINDINGS: Heart size is normal. Chronic aortic  atherosclerosis. The lungs are clear. The vascularity is normal. No effusions. Bilateral nipple shadows. No acute bone finding. IMPRESSION: No active cardiopulmonary disease.  Aortic atherosclerosis. Electronically Signed   By: Nelson Chimes M.D.   On: 10/02/2017 14:49   Ct Maxillofacial Wo Contrast  Result Date: 09/23/2017 CLINICAL DATA:  72 year old female tripped on rug and fell hitting head on tile floor. Was unconscious for short period of time. Initial encounter. EXAM: CT HEAD WITHOUT CONTRAST CT MAXILLOFACIAL WITHOUT CONTRAST TECHNIQUE: Multidetector CT imaging of the head and maxillofacial structures were performed using the standard protocol without intravenous contrast. Multiplanar CT image reconstructions of the maxillofacial structures were also generated. COMPARISON:  09/17/2017. FINDINGS: CT HEAD FINDINGS Brain: No intracranial hemorrhage or CT evidence of large acute infarct. No intracranial mass lesion noted on this unenhanced exam. Mild atrophy. Prominent perivascular space versus remote lacunar infarct right lenticular nucleus. Vascular: Vascular calcifications Skull: No skull fracture Other: Negative CT MAXILLOFACIAL FINDINGS Osseous: No facial fracture. Orbits: No orbital floor fracture or primary orbital abnormality noted. Sinuses: Clear. Soft tissues: No worrisome soft tissue abnormality. Other: Cervical spondylotic changes C5-6 and C6-7. Visualized aspect of the cervical spine without fracture identified. IMPRESSION: 1. No skull fracture or intracranial hemorrhage. 2. No facial or orbital fracture. 3. Mild atrophy. Electronically Signed   By: Genia Del M.D.   On: 09/23/2017 14:34     CBC Recent Labs  Lab 10/02/17 1249 10/03/17 0451  WBC 9.4 5.5  HGB 13.1 12.5  HCT 37.7 36.0  PLT 245 221  MCV 95.6 95.7  MCH 33.1 33.1  MCHC 34.7 34.6  RDW 13.2 13.3  LYMPHSABS 0.9*  --   MONOABS 0.8  --   EOSABS 0.0  --   BASOSABS 0.0  --     Chemistries  Recent Labs  Lab  10/02/17 1249 10/03/17 0451  NA 141 143  K 4.2 3.6  CL 107 108  CO2 26 27  GLUCOSE 95 83  BUN 22 19  CREATININE 0.83 0.85  CALCIUM 9.4 8.6*  AST 31  --   ALT <5  --   ALKPHOS 62  --   BILITOT 1.1  --    ------------------------------------------------------------------------------------------------------------------ estimated creatinine clearance is 32 mL/min (by C-G formula based on SCr of 0.85 mg/dL). ------------------------------------------------------------------------------------------------------------------ No results for input(s): HGBA1C  in the last 72 hours. ------------------------------------------------------------------------------------------------------------------ No results for input(s): CHOL, HDL, LDLCALC, TRIG, CHOLHDL, LDLDIRECT in the last 72 hours. ------------------------------------------------------------------------------------------------------------------ No results for input(s): TSH, T4TOTAL, T3FREE, THYROIDAB in the last 72 hours.  Invalid input(s): FREET3 ------------------------------------------------------------------------------------------------------------------ No results for input(s): VITAMINB12, FOLATE, FERRITIN, TIBC, IRON, RETICCTPCT in the last 72 hours.  Coagulation profile No results for input(s): INR, PROTIME in the last 168 hours.  No results for input(s): DDIMER in the last 72 hours.  Cardiac Enzymes Recent Labs  Lab 10/02/17 1720 10/02/17 2251 10/03/17 0451  TROPONINI <0.03 <0.03 <0.03   ------------------------------------------------------------------------------------------------------------------ Invalid input(s): POCBNP    Assessment & Plan   72 year old female patient with history of dementia, Parkinson's disease, arthritis presented to emergency room after passing out  -Syncope and collapse This is related to orthostatic hypotension possibly autonomic Continue IV fluid I started patient on  midodrine   -Dehydration IV fluids  -Advanced dementia Supportive care  -Parkinson's disease Supportive care Continue  Sinemet and amantadine      Code Status Orders  (From admission, onward)         Start     Ordered   10/02/17 1700  Full code  Continuous     10/02/17 1659        Code Status History    This patient has a current code status but no historical code status.    Advance Directive Documentation     Most Recent Value  Type of Advance Directive  Healthcare Power of Attorney, Living will  Pre-existing out of facility DNR order (yellow form or pink MOST form)  -  "MOST" Form in Place?  -           Consults none  DVT Prophylaxis  Lovenox   Lab Results  Component Value Date   PLT 221 10/03/2017     Time Spent in minutes   42min  Greater than 50% of time spent in care coordination and counseling patient regarding the condition and plan of care.   Dustin Flock M.D on 10/03/2017 at 2:53 PM  Between 7am to 6pm - Pager - [phone]  After 6pm go to [email or web address] - Proofreader  [employer]   Office  [phone]

## 2017-10-03 NOTE — Progress Notes (Signed)
RN spoke with daughter in detail.  She has both healthcare and regular power of attorney.  She would like her mother to go to a facility to get closer observation and care.  Beth requested for the MD to suggest a facility and let him agree because it will be easier than arguing with him.  Please notify Beth any time there is a change in patient status and before she is discharged.   Beth spoke with case management and they explained that at this time it might not be covered by insurance.  Phillis Knack, RN

## 2017-10-03 NOTE — Progress Notes (Signed)
Initial Nutrition Assessment  DOCUMENTATION CODES:   Severe malnutrition in context of chronic illness  INTERVENTION:   Ensure Enlive po BID, each supplement provides 350 kcal and 20 grams of protein  Magic cup TID with meals, each supplement provides 290 kcal and 9 grams of protein  MVI daily  Dysphagia 3 diet   NUTRITION DIAGNOSIS:   Severe Malnutrition related to chronic illness(parkinson, dementia) as evidenced by severe fat depletion, severe muscle depletion.  GOAL:   Patient will meet greater than or equal to 90% of their needs  MONITOR:   PO intake, Supplement acceptance, Labs, Weight trends, Skin, I & O's  REASON FOR ASSESSMENT:   Other (Comment)(low BMI)    ASSESSMENT:   72 y/o female with h/o parkinsons, dementia, DM admitted with syncope    Visited pt's room today. Pt is a poor historian and unable to provide detailed nutrition related history. Pt appears severely malnourished. Suspect pt with poor appetite and oral intake at baseline. Per chart, pt has lost 4lbs(5%) over the past 10 days. Suspect weight loss is r/t dehydration as pt previously weight stable. RD will change pt to dysphagia 3 diet in the setting of her parkinson's and dementia. Pt may benefit from SLP evaluation to determine baseline needs. RD will add supplements to help pt meet her estimated needs. Pt likely at moderate refeeding risk; recommend monitor K, Mg and P labs as oral intake improves.   Medications reviewed and include: aspirin, lovenox, meloxicam, mirtazapine, MVI, Vit D, NaCl @75ml /hr  Labs reviewed: glucose 69  NUTRITION - FOCUSED PHYSICAL EXAM:    Most Recent Value  Orbital Region  Severe depletion  Upper Arm Region  Severe depletion  Thoracic and Lumbar Region  Severe depletion  Buccal Region  Severe depletion  Temple Region  Severe depletion  Clavicle Bone Region  Severe depletion  Clavicle and Acromion Bone Region  Severe depletion  Scapular Bone Region  Severe  depletion  Dorsal Hand  Severe depletion  Patellar Region  Severe depletion  Anterior Thigh Region  Severe depletion  Posterior Calf Region  Severe depletion  Edema (RD Assessment)  None  Hair  Reviewed  Eyes  Reviewed  Mouth  Reviewed  Skin  Reviewed  Nails  Reviewed     Diet Order:   Diet Order            DIET DYS 3 Room service appropriate? Yes; Fluid consistency: Thin  Diet effective now             EDUCATION NEEDS:   Not appropriate for education at this time  Skin:  Skin Assessment: Reviewed RN Assessment(ecchymosis, skin tears )  Last BM:  8/15  Height:   Ht Readings from Last 1 Encounters:  10/02/17 5\' 3"  (1.6 m)    Weight:   Wt Readings from Last 1 Encounters:  10/03/17 34.9 kg    Ideal Body Weight:  52.3 kg  BMI:  Body mass index is 13.64 kg/m.  Estimated Nutritional Needs:   Kcal:  1200-1400kcal/day   Protein:  53-60g/day   Fluid:  800-1152ml/day   Koleen Distance MS, RD, LDN Pager #- [phone] Office#- [phone] After Hours Pager: [phone]

## 2017-10-03 NOTE — ACP (Advance Care Planning) (Addendum)
[name] - daughter - power of attorney and healthcare power of attorney  [phone]
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
## 2017-10-04 ENCOUNTER — Observation Stay: Payer: Medicare HMO

## 2017-10-04 ENCOUNTER — Encounter: Payer: Self-pay | Admitting: Licensed Clinical Social Worker

## 2017-10-04 DIAGNOSIS — E43 Unspecified severe protein-calorie malnutrition: Secondary | ICD-10-CM

## 2017-10-04 LAB — GLUCOSE, CAPILLARY
GLUCOSE-CAPILLARY: 99 mg/dL (ref 70–99)
Glucose-Capillary: 43 mg/dL — CL (ref 70–99)
Glucose-Capillary: 44 mg/dL — CL (ref 70–99)
Glucose-Capillary: 113 mg/dL — ABNORMAL HIGH (ref 70–99)

## 2017-10-04 LAB — PHOSPHORUS: Phosphorus: 2.9 mg/dL (ref 2.5–4.6)

## 2017-10-04 LAB — MAGNESIUM: Magnesium: 2 mg/dL (ref 1.7–2.4)

## 2017-10-04 MED ORDER — ENSURE ENLIVE PO LIQD: 237.0000 mL | Freq: Two times a day (BID) | ORAL | 12 refills | Status: DC

## 2017-10-04 MED ORDER — MIDODRINE HCL 10 MG PO TABS: 10.0000 mg | ORAL_TABLET | Freq: Three times a day (TID) | ORAL | 0 refills | Status: DC

## 2017-10-04 MED ORDER — DEXTROSE 50 % IV SOLN
INTRAVENOUS | Status: AC
  Administered 2017-10-04: 25 mL via INTRAVENOUS
  Filled 2017-10-04: qty 50

## 2017-10-04 MED ORDER — DEXTROSE 50 % IV SOLN
25.0000 mL | Freq: Once | INTRAVENOUS | Status: AC
  Administered 2017-10-04: 25 mL via INTRAVENOUS

## 2017-10-04 NOTE — Progress Notes (Signed)
Patient is not looking well.  Her skin is pale and cold.  I do not feel it is safe for the patient to go home.  Her husband is confused as well.  Patient needs to be re-evaluated by PT.  She needs a higher level of care.  She is lethargic and not able to care for herself at home.  The daughter in [place]) faxed the power of attorney papers to the unit.  They are in the chart.  MD - please discuss code status with her daughter STAT because we may have to call a code soon.    Text page sent to Dr. Vianne Bulls.  She ordered a chest xray and CBG.  She will call the daughter [name]).  Phillis Knack, RN

## 2017-10-04 NOTE — Progress Notes (Signed)
Patient blood pressure improved with IV fluids, Midrin.  Patient stable for discharge today added Midodrine  to her home medicines.  Discharge instructions on the computer, discussed with patient's husband.  Arrange home health physical therapy.  Discharge home today.

## 2017-10-04 NOTE — Clinical Social Work Note (Signed)
CSW received consult that family has concerns about the patient's well being. HHSW has been ordered, which will assist with possible placement and ability to monitor the patient within the community. The patient's HCPOA is in agreement with this plan per the [facility] note on 10/03/17. The CSW will assess the patient when able; however, the patient has a safe discharge plan at this time that includes family other than the patient's spouse.   Jillian Vaughn, MSW, Latanya Presser [phone]

## 2017-10-04 NOTE — Progress Notes (Signed)
[place] [facility]                                                                                                                                                                                  Patient Demographics   Jillian Vaughn, is a 72 y.o. female, DOB - 1945/12/29, UYQ:[id number]  Admit date - 10/02/2017   Admitting Physician Saundra Shelling, MD  Outpatient Primary MD for the patient is Morayati, [name], MD   LOS - 0  Subjective: Patient's admitted with syncope and collapse.  Was noted to have orthostatic hypotension was given IV fluids.  bpc better this am,started on midodrine.suppose to  Be discharged but discharge cancelled  Due to lethargy.   Review of Systems:   CONSTITUTIONAL: No documented fever. No fatigue, weakness. No weight gain, no weight loss.  EYES: No blurry or double vision.  Appears cachectic ENT: No tinnitus. No postnasal drip. No redness of the oropharynx.  RESPIRATORY: No cough, no wheeze, no hemoptysis. No dyspnea.  CARDIOVASCULAR: No chest pain. No orthopnea. No palpitations. No syncope.  GASTROINTESTINAL: No nausea, no vomiting or diarrhea. No abdominal pain. No melena or hematochezia.  GENITOURINARY: No dysuria or hematuria.  ENDOCRINE: No polyuria or nocturia. No heat or cold intolerance.  HEMATOLOGY: No anemia. No bruising. No bleeding.  INTEGUMENTARY: No rashes. No lesions.  MUSCULOSKELETAL: No arthritis. No swelling. No gout.  NEUROLOGIC: No numbness, tingling, or ataxia. No seizure-type activity.  PSYCHIATRIC: No anxiety. No insomnia. No ADD.    Vitals:   Vitals:   10/04/17 0758 10/04/17 1344 10/04/17 1618 10/04/17 1934  BP: (!) 104/49 131/68 (!) 148/77 (!) 108/51  Pulse: 91 83 80 (!) 104  Resp: 18  17 18   Temp: 97.7 F (36.5 C)  97.8 F (36.6 C) 98.4 F (36.9 C)  TempSrc:   Oral Oral  SpO2: 99% 94% 99% 98%  Weight:      Height:        Wt Readings from Last 3 Encounters:  10/04/17 35.8 kg  09/23/17  36.7 kg  09/17/17 36.8 kg     Intake/Output Summary (Last 24 hours) at 10/04/2017 2308 Last data filed at 10/04/2017 1834 Gross per 24 hour  Intake -  Output 650 ml  Net -650 ml    Physical Exam:   GENERAL: Pleasant-appearing in no apparent distress.  HEAD, EYES, EARS, NOSE AND THROAT: Atraumatic, normocephalic. Extraocular muscles are intact. Pupils equal and reactive to light. Sclerae anicteric. No conjunctival injection. No oro-pharyngeal erythema.  NECK: Supple. There is no jugular venous distention. No bruits, no lymphadenopathy, no thyromegaly.  HEART: Regular rate and rhythm,. No murmurs, no rubs, no clicks.  LUNGS: Clear to auscultation bilaterally. No rales or rhonchi. No wheezes.  ABDOMEN: Soft, flat, nontender, nondistended. Has good bowel sounds. No hepatosplenomegaly appreciated.  EXTREMITIES: No evidence of any cyanosis, clubbing, or peripheral edema.  +2 pedal and radial pulses bilaterally.  NEUROLOGIC: The patient is alert, awake, and oriented x3 with no focal motor or sensory deficits appreciated bilaterally.  SKIN: Moist and warm with no rashes appreciated.  Psych: Not anxious, depressed LN: No inguinal LN enlargement    Antibiotics   Anti-infectives (From admission, onward)   None      Medications   Scheduled Meds: . amantadine  100 mg Oral BID  . aspirin EC  325 mg Oral Daily  . carbidopa-levodopa  2 tablet Oral QID  . enoxaparin (LOVENOX) injection  30 mg Subcutaneous Q24H  . feeding supplement (ENSURE ENLIVE)  237 mL Oral BID BM  . lubiprostone  24 mcg Oral Q breakfast  . meloxicam  7.5 mg Oral Daily  . mirtazapine  7.5 mg Oral QHS  . multivitamin with minerals  1 tablet Oral Daily  . pravastatin  40 mg Oral QHS  . QUEtiapine  25 mg Oral Once  . rOPINIRole  0.5 mg Oral Daily  . sodium chloride flush  3 mL Intravenous Q12H  . [START ON 10/08/2017] Vitamin D (Ergocalciferol)  50,000 Units Oral Q Wed   Continuous Infusions: . sodium chloride  Stopped (10/03/17 1350)   PRN Meds:.acetaminophen **OR** acetaminophen, ondansetron **OR** ondansetron (ZOFRAN) IV, senna-docusate   Data Review:   Micro Results No results found for this or any previous visit (from the past 240 hour(s)).  Radiology Reports Dg Chest 1 View  Result Date: 10/04/2017 CLINICAL DATA:  Hypertension. EXAM: CHEST  1 VIEW COMPARISON:  None. FINDINGS: Cardiomediastinal silhouette is normal. Mediastinal contours appear intact. Calcific atherosclerotic disease of the aorta. There is no evidence of focal airspace consolidation, pleural effusion or pneumothorax. Hyperinflation of the lungs. Osseous structures are without acute abnormality. Soft tissues are grossly normal. IMPRESSION: Hyperinflation of the lungs. Otherwise no acute disease. Electronically Signed   By: Fidela Salisbury M.D.   On: 10/04/2017 17:13   Ct Head Wo Contrast  Result Date: 10/04/2017 CLINICAL DATA:  71 year old female with recent fall, increased lethargy today. EXAM: CT HEAD WITHOUT CONTRAST TECHNIQUE: Contiguous axial images were obtained from the base of the skull through the vertex without intravenous contrast. COMPARISON:  Head CT without contrast 10/02/2017 and earlier. FINDINGS: Brain: Cerebral volume is within normal limits for age. No midline shift, ventriculomegaly, mass effect, evidence of mass lesion, intracranial hemorrhage or evidence of cortically based acute infarction. Gray-white matter differentiation is within normal limits throughout the brain. Right inferior lentiform perivascular space (normal variant). No cortical encephalomalacia. Vascular: Calcified atherosclerosis at the skull base. No suspicious intracranial vascular hyperdensity. Skull: No acute osseous abnormality identified. Sinuses/Orbits: Visualized paranasal sinuses and mastoids are stable and well pneumatized. Other: No acute orbit or scalp soft tissue findings. IMPRESSION: Stable and normal for age noncontrast CT  appearance of the brain. Electronically Signed   By: Genevie Ann M.D.   On: 10/04/2017 18:15   Ct Head Wo Contrast  Result Date: 10/02/2017 CLINICAL DATA:  Unwitnessed fall. History of Parkinson's disease and dementia EXAM: CT HEAD WITHOUT CONTRAST TECHNIQUE: Contiguous axial images were obtained from the base of the skull through the vertex without intravenous contrast. COMPARISON:  September 23, 2017 FINDINGS: Brain: There is age related volume loss. There is no intracranial mass, hemorrhage, extra-axial fluid collection, or midline  shift. Gray-white compartments appear normal except for minimal periventricular small vessel disease. No evident acute infarct. Vascular: No hyperdense vessel. There are foci of calcification in each carotid siphon. Skull: Bony calvarium appears intact. Sinuses/Orbits: There is mucosal thickening in several ethmoid air cells. Other visualized paranasal sinuses are clear. Orbits appear symmetric bilaterally except for previous cataract removal on the right. Other: Mastoid air cells are clear. There is debris in each external auditory canal. IMPRESSION: Age related volume loss with minimal periventricular small vessel disease. No acute infarct. No mass or hemorrhage. Foci of arterial vascular calcification noted. There is mucosal thickening in several ethmoid air cells. There is probable cerumen in each external auditory canal. Electronically Signed   By: Lowella Grip III M.D.   On: 10/02/2017 14:35   Ct Head Wo Contrast  Result Date: 09/23/2017 CLINICAL DATA:  72 year old female tripped on rug and fell hitting head on tile floor. Was unconscious for short period of time. Initial encounter. EXAM: CT HEAD WITHOUT CONTRAST CT MAXILLOFACIAL WITHOUT CONTRAST TECHNIQUE: Multidetector CT imaging of the head and maxillofacial structures were performed using the standard protocol without intravenous contrast. Multiplanar CT image reconstructions of the maxillofacial structures were also  generated. COMPARISON:  09/17/2017. FINDINGS: CT HEAD FINDINGS Brain: No intracranial hemorrhage or CT evidence of large acute infarct. No intracranial mass lesion noted on this unenhanced exam. Mild atrophy. Prominent perivascular space versus remote lacunar infarct right lenticular nucleus. Vascular: Vascular calcifications Skull: No skull fracture Other: Negative CT MAXILLOFACIAL FINDINGS Osseous: No facial fracture. Orbits: No orbital floor fracture or primary orbital abnormality noted. Sinuses: Clear. Soft tissues: No worrisome soft tissue abnormality. Other: Cervical spondylotic changes C5-6 and C6-7. Visualized aspect of the cervical spine without fracture identified. IMPRESSION: 1. No skull fracture or intracranial hemorrhage. 2. No facial or orbital fracture. 3. Mild atrophy. Electronically Signed   By: Genia Del M.D.   On: 09/23/2017 14:34   Ct Head Wo Contrast  Result Date: 09/17/2017 CLINICAL DATA:  [place] in bathroom hit head on toilet EXAM: CT HEAD WITHOUT CONTRAST CT CERVICAL SPINE WITHOUT CONTRAST TECHNIQUE: Multidetector CT imaging of the head and cervical spine was performed following the standard protocol without intravenous contrast. Multiplanar CT image reconstructions of the cervical spine were also generated. COMPARISON:  CT 07/26/2017, 06/19/2017 FINDINGS: CT HEAD FINDINGS Brain: No acute territorial infarction, hemorrhage or intracranial mass is visualized. 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 Mild atrophy. Stable ventricle size. Vascular: No hyperdense vessels. Scattered calcification at the carotid siphon Skull: Normal. Negative for fracture or focal lesion. Sinuses/Orbits: No acute finding. Other: None CT CERVICAL SPINE FINDINGS Alignment: No subluxation.  Facet alignment within normal limits Skull base and vertebrae: No acute fracture. No primary bone lesion or focal pathologic process. Soft tissues and spinal canal: No prevertebral fluid or swelling. No visible  canal hematoma. Disc levels: Moderate degenerative changes at C5-C6 and mild to moderate degenerative changes at C6-C7. Upper chest: Negative. Other: None IMPRESSION: 1. No CT evidence for acute intracranial abnormality.  Mild atrophy. 2. Degenerative changes at C5-C6 and C6-C7. No acute osseous abnormality. Electronically Signed   By: Donavan Foil M.D.   On: 09/17/2017 19:39   Ct Cervical Spine Wo Contrast  Result Date: 09/17/2017 CLINICAL DATA:  [place] in bathroom hit head on toilet EXAM: CT HEAD WITHOUT CONTRAST CT CERVICAL SPINE WITHOUT CONTRAST TECHNIQUE: Multidetector CT imaging of the head and cervical spine was performed following the standard protocol without intravenous contrast. Multiplanar CT image reconstructions of  the cervical spine were also generated. COMPARISON:  CT 07/26/2017, 06/19/2017 FINDINGS: CT HEAD FINDINGS Brain: No acute territorial infarction, hemorrhage or intracranial mass is visualized. 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 Mild atrophy. Stable ventricle size. Vascular: No hyperdense vessels. Scattered calcification at the carotid siphon Skull: Normal. Negative for fracture or focal lesion. Sinuses/Orbits: No acute finding. Other: None CT CERVICAL SPINE FINDINGS Alignment: No subluxation.  Facet alignment within normal limits Skull base and vertebrae: No acute fracture. No primary bone lesion or focal pathologic process. Soft tissues and spinal canal: No prevertebral fluid or swelling. No visible canal hematoma. Disc levels: Moderate degenerative changes at C5-C6 and mild to moderate degenerative changes at C6-C7. Upper chest: Negative. Other: None IMPRESSION: 1. No CT evidence for acute intracranial abnormality.  Mild atrophy. 2. Degenerative changes at C5-C6 and C6-C7. No acute osseous abnormality. Electronically Signed   By: Donavan Foil M.D.   On: 09/17/2017 19:39   US Carotid Bilateral  Result Date: 10/03/2017 CLINICAL DATA:  Syncope and collapse.  History  of hypertension. EXAM: BILATERAL CAROTID DUPLEX ULTRASOUND TECHNIQUE: Pearline Cables scale imaging, color Doppler and duplex ultrasound were performed of bilateral carotid and vertebral arteries in the neck. COMPARISON:  None. FINDINGS: Criteria: Quantification of carotid stenosis is based on velocity parameters that correlate the residual internal carotid diameter with NASCET-based stenosis levels, using the diameter of the distal internal carotid lumen as the denominator for stenosis measurement. The following velocity measurements were obtained: RIGHT ICA:  96/34 cm/sec CCA:  97/67 cm/sec SYSTOLIC ICA/CCA RATIO:  1.3 ECA:  131 cm/sec LEFT ICA:  82/30 cm/sec CCA:  34/19 cm/sec SYSTOLIC ICA/CCA RATIO:  1.3 ECA:  73 cm/sec RIGHT CAROTID ARTERY: There is a minimal amount of echogenic plaque within the right carotid bulb (images 15 and 17), not resulting in elevated peak systolic velocities within the interrogated course the right internal carotid artery to suggest a hemodynamically significant stenosis. RIGHT VERTEBRAL ARTERY:  Antegrade flow LEFT CAROTID ARTERY: There is a minimal amount of echogenic plaque within the left carotid bulb (images 48 and 50), not resulting in elevated peak systolic velocities within the interrogated course the left internal carotid artery to suggest a hemodynamically significant stenosis. LEFT VERTEBRAL ARTERY:  Antegrade flow IMPRESSION: Minimal amount of bilateral atherosclerotic plaque, not resulting in a hemodynamically significant stenosis within either internal carotid artery. Electronically Signed   By: Sandi Mariscal M.D.   On: 10/03/2017 09:56   Dg Chest Portable 1 View  Result Date: 10/02/2017 CLINICAL DATA:  Found at home on the floor. EXAM: PORTABLE CHEST 1 VIEW COMPARISON:  06/19/2017 FINDINGS: Heart size is normal. Chronic aortic atherosclerosis. The lungs are clear. The vascularity is normal. No effusions. Bilateral nipple shadows. No acute bone finding. IMPRESSION: No active  cardiopulmonary disease.  Aortic atherosclerosis. Electronically Signed   By: Nelson Chimes M.D.   On: 10/02/2017 14:49   Ct Maxillofacial Wo Contrast  Result Date: 09/23/2017 CLINICAL DATA:  72 year old female tripped on rug and fell hitting head on tile floor. Was unconscious for short period of time. Initial encounter. EXAM: CT HEAD WITHOUT CONTRAST CT MAXILLOFACIAL WITHOUT CONTRAST TECHNIQUE: Multidetector CT imaging of the head and maxillofacial structures were performed using the standard protocol without intravenous contrast. Multiplanar CT image reconstructions of the maxillofacial structures were also generated. COMPARISON:  09/17/2017. FINDINGS: CT HEAD FINDINGS Brain: No intracranial hemorrhage or CT evidence of large acute infarct. No intracranial mass lesion noted on this unenhanced exam. Mild atrophy. Prominent perivascular space versus  remote lacunar infarct right lenticular nucleus. Vascular: Vascular calcifications Skull: No skull fracture Other: Negative CT MAXILLOFACIAL FINDINGS Osseous: No facial fracture. Orbits: No orbital floor fracture or primary orbital abnormality noted. Sinuses: Clear. Soft tissues: No worrisome soft tissue abnormality. Other: Cervical spondylotic changes C5-6 and C6-7. Visualized aspect of the cervical spine without fracture identified. IMPRESSION: 1. No skull fracture or intracranial hemorrhage. 2. No facial or orbital fracture. 3. Mild atrophy. Electronically Signed   By: Genia Del M.D.   On: 09/23/2017 14:34     CBC Recent Labs  Lab 10/02/17 1249 10/03/17 0451  WBC 9.4 5.5  HGB 13.1 12.5  HCT 37.7 36.0  PLT 245 221  MCV 95.6 95.7  MCH 33.1 33.1  MCHC 34.7 34.6  RDW 13.2 13.3  LYMPHSABS 0.9*  --   MONOABS 0.8  --   EOSABS 0.0  --   BASOSABS 0.0  --     Chemistries  Recent Labs  Lab 10/02/17 1249 10/03/17 0451 10/04/17 0536  NA 141 143  --   K 4.2 3.6  --   CL 107 108  --   CO2 26 27  --   GLUCOSE 95 83  --   BUN 22 19  --    CREATININE 0.83 0.85  --   CALCIUM 9.4 8.6*  --   MG  --   --  2.0  AST 31  --   --   ALT <5  --   --   ALKPHOS 62  --   --   BILITOT 1.1  --   --    ------------------------------------------------------------------------------------------------------------------ estimated creatinine clearance is 32.8 mL/min (by C-G formula based on SCr of 0.85 mg/dL). ------------------------------------------------------------------------------------------------------------------ No results for input(s): HGBA1C in the last 72 hours. ------------------------------------------------------------------------------------------------------------------ No results for input(s): CHOL, HDL, LDLCALC, TRIG, CHOLHDL, LDLDIRECT in the last 72 hours. ------------------------------------------------------------------------------------------------------------------ No results for input(s): TSH, T4TOTAL, T3FREE, THYROIDAB in the last 72 hours.  Invalid input(s): FREET3 ------------------------------------------------------------------------------------------------------------------ No results for input(s): VITAMINB12, FOLATE, FERRITIN, TIBC, IRON, RETICCTPCT in the last 72 hours.  Coagulation profile No results for input(s): INR, PROTIME in the last 168 hours.  No results for input(s): DDIMER in the last 72 hours.  Cardiac Enzymes Recent Labs  Lab 10/02/17 1720 10/02/17 2251 10/03/17 0451  TROPONINI <0.03 <0.03 <0.03   ------------------------------------------------------------------------------------------------------------------ Invalid input(s): POCBNP    Assessment & Plan   72 year old female patient with history of dementia, Parkinson's disease, arthritis presented to emergency room after passing out  -Syncope and collapse This is related to orthostatic hypotension possibly autonomic dysfuction, Improved.   -Dehydration IV fluids  -Advanced dementia Supportive care  -Parkinson's  disease Supportive care Continue  Sinemet and amantadine      Code Status Orders  (From admission, onward)         Start     Ordered   10/02/17 1700  Full code  Continuous     10/02/17 1659        Code Status History    This patient has a current code status but no historical code status.    Advance Directive Documentation     Most Recent Value  Type of Advance Directive  Healthcare Power of Attorney, Living will  Pre-existing out of facility DNR order (yellow form or pink MOST form)  -  "MOST" Form in Place?  -    DNR       Consults none  DVT Prophylaxis  Lovenox   Lab Results  Component Value Date  PLT 221 10/03/2017     Time Spent in minutes   63min  Greater than 50% of time spent in care coordination and counseling patient regarding the condition and plan of care.   Epifanio Lesches M.D on 10/04/2017 at 11:08 PM  Between 7am to 6pm - Pager - [phone]  After 6pm go to [email or web address] - Proofreader  [employer]   Office  [phone]

## 2017-10-04 NOTE — Progress Notes (Signed)
Hypoglycemic Event  CBG: 44  Treatment: meal of ice cream cup and half of ensure, refused juice  Symptoms: shaking, however unsure if related due to patient still shaking after CBG level elevated, given warm blanket   Delay in rechecking CBG due to having to feed patient meal   Follow-up CBG  TIme: 0623 CBG Result:43  Treatment: injection of dextrose  Follow up CBG:   0641             Result: 113  Possible Reasons for Event: n/a      White,Adrienne R

## 2017-10-04 NOTE — Progress Notes (Signed)
Discharge cancelled as pt became pale and lethargic.CT head did not show any stroke, Time spent;20 min

## 2017-10-04 NOTE — Care Management Note (Signed)
Case Management Note  Patient Details  Name: Jillian Vaughn MRN: [id number] Date of Birth: Jul 01, 1945  Subjective/Objective:    Patient to be discharged per MD order. Orders in place for home health services. Spoke with [employer] (POA) regarding transition of care planning. Family agreeable to home health and had previously been worked up for services via [employer]. Confirmed referral with [place] from [facility] for PT,RN and aide services. No DME needs. Family to transport.  Ines Bloomer RN BSN RNCM [phone]                  Action/Plan:   Expected Discharge Date:  10/04/17               Expected Discharge Plan:  [place]  In-House Referral:     Discharge planning Services  CM Consult  Post Acute Care Choice:  Home Health Choice offered to:  [facility] POA / Guardian  DME Arranged:    DME Agency:     HH Arranged:  RN, PT, Nurse's Aide [place] Agency:  [facility]  Status of Service:  Completed, signed off  If discussed at [place] of Stay Meetings, dates discussed:    Additional Comments:  Kathyrn Drown Jayceon Troy, RN 10/04/2017, 1:05 PM
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
## 2017-10-04 NOTE — Progress Notes (Signed)
Rapid Nurse came to help assess patient and Dr. Marcille Blanco came too.  Patient was warmer after 2 blankets.  Her blood pressure went up and her other vitals are normal.  Patient is lethargic.  Dr. Vianne Bulls added a CT scan of the head.  Dr. Marcille Blanco said the O2 and bolus of fluid were no longer needed (Dr. Vianne Bulls had suggested those).    Both doctors said to discontinue the BP medicine.   Phillis Knack, RN
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
## 2017-10-05 NOTE — Clinical Social Work Note (Addendum)
The CSW received a call from the attending RN about the patient's waxing and waning presentation due to her husband giving the patient home medications when staff is not present. The attending RN spoke with the [facility] who has said she is willing to private pay for skilled nursing. The CSW has sent a referral for such and is waiting for PT to reassess the patient for possible SNF placement. The CSW will follow and has advised CSW leadership of the continued stay barriers.  Santiago Bumpers, MSW, Latanya Presser [phone]

## 2017-10-05 NOTE — Plan of Care (Signed)
  Problem: Nutrition: Goal: Adequate nutrition will be maintained Outcome: Not Progressing   

## 2017-10-05 NOTE — NC FL2 (Signed)
[place] LEVEL OF CARE SCREENING TOOL     IDENTIFICATION  Patient Name: Jillian Vaughn Birthdate: October 03, 1945 Sex: female Admission Date (Current Location): 10/02/2017  [place] and [place] Number:  Engineering geologist and Address:  [facility], [address] [place], [place] [address]      Provider Number: [id number]  Attending Physician Name and Address:  Epifanio Lesches, MD  Relative Name and Phone Number:  Daye,Beth (Daughter/HCPOA)   [phone]     Current Level of Care: Hospital Recommended Level of Care: [place] Prior Approval Number:    Date Approved/Denied:   PASRR Number: [id number]  Discharge Plan: SNF    Current Diagnoses: Patient Active Problem List   Diagnosis Date Noted  . Protein-calorie malnutrition, severe 10/04/2017  . Syncope and collapse 10/02/2017  . Parkinson's disease ([place]) 06/10/2012    Orientation RESPIRATION BLADDER Height & Weight     Self, Place  Normal Incontinent Weight: 77 lb 3.2 oz (35 kg) Height:  5\' 3"  (160 cm)  BEHAVIORAL SYMPTOMS/MOOD NEUROLOGICAL BOWEL NUTRITION STATUS      Incontinent Diet(Dysphagia 3)  AMBULATORY STATUS COMMUNICATION OF NEEDS Skin   Extensive Assist Verbally Normal                       Personal Care Assistance Level of Assistance  Bathing, Feeding, Dressing Bathing Assistance: Maximum assistance Feeding assistance: Limited assistance Dressing Assistance: Maximum assistance     Functional Limitations Info  Sight, Hearing, Speech Sight Info: Adequate Hearing Info: Adequate Speech Info: Adequate    SPECIAL CARE FACTORS FREQUENCY  PT (By licensed PT), OT (By licensed OT)                    Contractures Contractures Info: Not present    Additional Factors Info  Code Status, Allergies, Psychotropic Code Status Info: DNR Allergies Info: No Known Allergies Psychotropic Info: Seroquel, Remeron         Current  Medications (10/05/2017):  This is the current hospital active medication list Current Facility-Administered Medications  Medication Dose Route Frequency Provider Last Rate Last Dose  . 0.9 %  sodium chloride infusion   Intravenous Continuous Saundra Shelling, MD   Stopped at 10/03/17 1350  . acetaminophen (TYLENOL) tablet 650 mg  650 mg Oral Q6H PRN Saundra Shelling, MD       Or  . acetaminophen (TYLENOL) suppository 650 mg  650 mg Rectal Q6H PRN Pyreddy, Reatha Harps, MD      . amantadine (SYMMETREL) capsule 100 mg  100 mg Oral BID Saundra Shelling, MD   Stopped at 10/05/17 1000  . aspirin EC tablet 325 mg  325 mg Oral Daily Saundra Shelling, MD   Stopped at 10/05/17 1000  . carbidopa-levodopa (SINEMET IR) 25-100 MG per tablet immediate release 2 tablet  2 tablet Oral QID Saundra Shelling, MD   Stopped at 10/05/17 1000  . enoxaparin (LOVENOX) injection 30 mg  30 mg Subcutaneous Q24H Saundra Shelling, MD   30 mg at 10/04/17 2129  . feeding supplement (ENSURE ENLIVE) (ENSURE ENLIVE) liquid 237 mL  237 mL Oral BID BM Dustin Flock, MD   237 mL at 10/04/17 1055  . lubiprostone (AMITIZA) capsule 24 mcg  24 mcg Oral Q breakfast Saundra Shelling, MD   Stopped at 10/05/17 0800  . meloxicam (MOBIC) tablet 7.5 mg  7.5 mg Oral Daily Saundra Shelling, MD   Stopped at 10/05/17 1000  . mirtazapine (REMERON) tablet 7.5  mg  7.5 mg Oral QHS Saundra Shelling, MD   7.5 mg at 10/04/17 2129  . multivitamin with minerals tablet 1 tablet  1 tablet Oral Daily Saundra Shelling, MD   1 tablet at 10/03/17 1106  . ondansetron (ZOFRAN) tablet 4 mg  4 mg Oral Q6H PRN Saundra Shelling, MD       Or  . ondansetron (ZOFRAN) injection 4 mg  4 mg Intravenous Q6H PRN Pyreddy, Pavan, MD      . pravastatin (PRAVACHOL) tablet 40 mg  40 mg Oral QHS Saundra Shelling, MD   40 mg at 10/04/17 2129  . QUEtiapine (SEROQUEL) tablet 25 mg  25 mg Oral Once Amelia Jo, MD      . rOPINIRole (REQUIP) tablet 0.5 mg  0.5 mg Oral Daily Saundra Shelling, MD   Stopped at  10/05/17 1000  . senna-docusate (Senokot-S) tablet 1 tablet  1 tablet Oral QHS PRN Pyreddy, Reatha Harps, MD      . sodium chloride flush (NS) 0.9 % injection 3 mL  3 mL Intravenous Q12H Pyreddy, Reatha Harps, MD   3 mL at 10/04/17 2130  . [START ON 10/08/2017] Vitamin D (Ergocalciferol) (DRISDOL) capsule 50,000 Units  50,000 Units Oral Q Wed Pyreddy, Reatha Harps, MD         Discharge Medications: Please see discharge summary for a list of discharge medications.  Relevant Imaging Results:  Relevant Lab Results:   Additional Information [phone]  [name], LCSW
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
## 2017-10-06 ENCOUNTER — Other Ambulatory Visit: Payer: Self-pay

## 2017-10-06 LAB — GLUCOSE, CAPILLARY
Glucose-Capillary: 97 mg/dL (ref 70–99)
Glucose-Capillary: 157 mg/dL — ABNORMAL HIGH (ref 70–99)

## 2017-10-06 MED ORDER — SODIUM CHLORIDE 0.9 % IV BOLUS
500.0000 mL | Freq: Once | INTRAVENOUS | Status: AC
  Administered 2017-10-06: 500 mL via INTRAVENOUS

## 2017-10-06 NOTE — Progress Notes (Signed)
Nutrition Follow Up Note   DOCUMENTATION CODES:   Severe malnutrition in context of chronic illness  INTERVENTION:   Ensure Enlive po BID, each supplement provides 350 kcal and 20 grams of protein  Magic cup TID with meals, each supplement provides 290 kcal and 9 grams of protein  MVI daily  Dysphagia 3 diet   NUTRITION DIAGNOSIS:   Severe Malnutrition related to chronic illness(parkinson, dementia) as evidenced by severe fat depletion, severe muscle depletion.  GOAL:   Patient will meet greater than or equal to 90% of their needs  -not met   MONITOR:   PO intake, Supplement acceptance, Labs, Weight trends, Skin, I & O's  ASSESSMENT:   72 y/o female with h/o parkinsons, dementia, severe malnutrition admitted with syncope    Pt eating <50% of meals; is being fed by nurse tech. Pt is drinking some Ensure and eating some Magic Cups. Per chart, pt is weight stable since admit. Recommend continue supplements and vitamins. Pt to discharge to SNF vs home with home health services.   Medications reviewed and include: aspirin, lovenox, meloxicam, mirtazapine, MVI, Vit D, NaCl _0 /hr  Labs reviewed: P 2.9 wnl, Mg 2.0 wnl- 8/17  NUTRITION - FOCUSED PHYSICAL EXAM:    Most Recent Value  Orbital Region  Severe depletion  Upper Arm Region  Severe depletion  Thoracic and Lumbar Region  Severe depletion  Buccal Region  Severe depletion  Temple Region  Severe depletion  Clavicle Bone Region  Severe depletion  Clavicle and Acromion Bone Region  Severe depletion  Scapular Bone Region  Severe depletion  Dorsal Hand  Severe depletion  Patellar Region  Severe depletion  Anterior Thigh Region  Severe depletion  Posterior Calf Region  Severe depletion  Edema (RD Assessment)  None  Hair  Reviewed  Eyes  Reviewed  Mouth  Reviewed  Skin  Reviewed  Nails  Reviewed     Diet Order:   Diet Order            DIET DYS 3 Room service appropriate? Yes; Fluid consistency: Thin  Diet  effective now             EDUCATION NEEDS:   Not appropriate for education at this time  Skin:  Skin Assessment: Reviewed RN Assessment(ecchymosis, skin tears )  Last BM:  8/18- type 6  Height:   Ht Readings from Last 1 Encounters:  10/02/17 _1  (1.6 m)    Weight:   Wt Readings from Last 1 Encounters:  10/06/17 34.6 kg    Ideal Body Weight:  52.3 kg  BMI:  Body mass index is 13.5 kg/m.  Estimated Nutritional Needs:   Kcal:  1200-1400kcal/day   Protein:  53-60g/day   Fluid:  800-1150m/day   CKoleen DistanceMS, RD, LDN Pager #- [phone]#- [phone] Hours Pager: [phone]

## 2017-10-06 NOTE — ED Provider Notes (Signed)
[facility] Emergency Department Provider Note   ____________________________________________   First MD Initiated Contact with Patient 10/02/17 1559     (approximate)  I have reviewed the triage vital signs and the nursing notes.   HISTORY  Chief Complaint Fall  History limited by dementia  HPI Jillian Vaughn is a 72 y.o. female with a known history of Parkinson's disease and dementia.  She also has arthritis.  She passed out 1130 this morning apparently.  Patient's husband called EMS.  He said that she was standing talking to him and just fell over.  Patient herself has dementia and cannot give much history.  She is unable to say exactly what happened.  She does respond to some commands but many she does not.    Past Medical History:  Diagnosis Date  . Arthritis   . Dementia   . Parkinson disease [facility])     Patient Active Problem List   Diagnosis Date Noted  . Protein-calorie malnutrition, severe 10/04/2017  . Syncope and collapse 10/02/2017  . Parkinson's disease ([place]) 06/10/2012    Past Surgical History:  Procedure Laterality Date  . APPENDECTOMY    . ESOPHAGOGASTRODUODENOSCOPY (EGD) WITH PROPOFOL N/A 06/19/2015   Procedure: ESOPHAGOGASTRODUODENOSCOPY (EGD) WITH PROPOFOL;  Surgeon: Lollie Sails, MD;  Location: [facility] ENDOSCOPY;  Service: Endoscopy;  Laterality: N/A;    Prior to Admission medications   Medication Sig Start Date End Date Taking? Authorizing Provider  acetaminophen (TYLENOL) 650 MG CR tablet Take 1,300 mg by mouth 2 (two) times daily as needed for pain.   Yes [provider]  amantadine (SYMMETREL) 100 MG capsule Take 100 mg by mouth 2 (two) times daily. 03/23/15  Yes [provider]  aspirin 325 MG tablet Take 325 mg by mouth daily.   Yes [provider]  carbidopa-levodopa (SINEMET) 25-100 MG tablet Take 2 tablets by mouth 4 (four) times daily.  03/02/15  Yes [provider]  lubiprostone  (AMITIZA) 24 MCG capsule Take 24 mcg by mouth daily with breakfast.   Yes [provider]  meloxicam (MOBIC) 7.5 MG tablet Take 7.5 mg by mouth daily.   Yes [provider]  mirtazapine (REMERON) 7.5 MG tablet Take 7.5 mg by mouth at bedtime.   Yes [provider]  pravastatin (PRAVACHOL) 40 MG tablet Take 40 mg by mouth at bedtime.   Yes [provider]  Riboflavin (VITAMIN B-2) 50 MG TABS Take 50 mg by mouth daily.   Yes [provider]  rOPINIRole (REQUIP) 0.5 MG tablet Take 0.5 mg by mouth daily.   Yes [provider]  Vitamin D, Ergocalciferol, (DRISDOL) 50000 units CAPS capsule Take 50,000 Units by mouth every Wednesday. 04/18/15  Yes [provider]  feeding supplement, ENSURE ENLIVE, (ENSURE ENLIVE) LIQD Take 237 mLs by mouth 2 (two) times daily between meals. 10/04/17   Epifanio Lesches, MD  midodrine (PROAMATINE) 10 MG tablet Take 1 tablet (10 mg total) by mouth 3 (three) times daily with meals. 10/04/17   Epifanio Lesches, MD    Allergies Patient has no known allergies.  History reviewed. No pertinent family history.  Social History Social History   Tobacco Use  . Smoking status: Never Smoker  . Smokeless tobacco: Never Used  Substance Use Topics  . Alcohol use: Yes  . Drug use: No    Review of Systems  Unable to obtain ____________________________________________   PHYSICAL EXAM:  VITAL SIGNS: ED Triage Vitals  Enc Vitals Group  BP 10/02/17 1231 121/88     Pulse Rate 10/02/17 1231 83     Resp 10/02/17 1231 18     Temp 10/02/17 1231 97.9 F (36.6 C)     Temp Source 10/02/17 1231 Oral     SpO2 10/02/17 1231 100 %     Weight 10/02/17 1233 85 lb (38.6 kg)     Height 10/02/17 1233 5\' 3"  (1.6 m)     Head Circumference --      Peak Flow --      Pain Score 10/02/17 1232 0     Pain Loc --      Pain Edu? --      Excl. in [place]? --     Constitutional: Sleepy but arousable does not seem to be in  any distress Eyes: Conjunctivae are normal. PERRL. EOMI. Head: Atraumatic. Nose: No congestion/rhinnorhea. Mouth/Throat: Mucous membranes are moist.  Oropharynx non-erythematous. Neck: No stridor. Cardiovascular: Normal rate, regular rhythm. Grossly normal heart sounds.  Good peripheral circulation. Respiratory: Normal respiratory effort.  No retractions. Lungs CTAB. Gastrointestinal: Soft and nontender. No distention. No abdominal bruits. No CVA tenderness. Musculoskeletal: No lower extremity tenderness nor edema.  No joint effusions. Neurologic: Limited speech but this appears to be normal.. No gross focal neurologic deficits are appreciated.  Skin:  Skin is warm, dry and intact. No rash noted.   ____________________________________________   LABS (all labs ordered are listed, but only abnormal results are displayed)  Labs Reviewed  CBC WITH DIFFERENTIAL/PLATELET - Abnormal; Notable for the following components:      Result Value   Neutro Abs 7.8 (*)    Lymphs Abs 0.9 (*)    All other components within normal limits  BASIC METABOLIC PANEL - Abnormal; Notable for the following components:   Calcium 8.6 (*)    All other components within normal limits  CBC - Abnormal; Notable for the following components:   RBC 3.77 (*)    All other components within normal limits  GLUCOSE, CAPILLARY - Abnormal; Notable for the following components:   Glucose-Capillary 69 (*)    All other components within normal limits  GLUCOSE, CAPILLARY - Abnormal; Notable for the following components:   Glucose-Capillary 106 (*)    All other components within normal limits  GLUCOSE, CAPILLARY - Abnormal; Notable for the following components:   Glucose-Capillary 44 (*)    All other components within normal limits  GLUCOSE, CAPILLARY - Abnormal; Notable for the following components:   Glucose-Capillary 43 (*)    All other components within normal limits  GLUCOSE, CAPILLARY - Abnormal; Notable for the  following components:   Glucose-Capillary 113 (*)    All other components within normal limits  COMPREHENSIVE METABOLIC PANEL  TROPONIN I  TROPONIN I  TROPONIN I  TROPONIN I  GLUCOSE, CAPILLARY  CORTISOL  MAGNESIUM  PHOSPHORUS  GLUCOSE, CAPILLARY  GLUCOSE, CAPILLARY  URINALYSIS, COMPLETE (UACMP) WITH MICROSCOPIC  URINE DRUG SCREEN, QUALITATIVE ([facility] ONLY)   ____________________________________________  EKG   ____________________________________________  RADIOLOGY  ED MD interpretation: ET read by radiology is no acute disease X-ray read by radiology reviewed by me shows no acute disease  Official radiology report(s): No results found.  ____________________________________________   PROCEDURES  Procedure(s) performed:   Procedures  Critical Care performed:   ____________________________________________   INITIAL IMPRESSION / ASSESSMENT AND PLAN / ED COURSE Patient with syncope for no apparent reason.  We will admit her and try to elicit the etiology of her syncope.  ____________________________________________   FINAL CLINICAL IMPRESSION(S) / ED DIAGNOSES  Final diagnoses:  Syncope and collapse     ED Discharge Orders         Ordered    midodrine (PROAMATINE) 10 MG tablet  3 times daily with meals     10/04/17 1136    feeding supplement, ENSURE ENLIVE, (ENSURE ENLIVE) LIQD  2 times daily between meals     10/04/17 1136           Note:  This document was prepared using Dragon voice recognition software and may include unintentional dictation errors.    Nena Polio, MD 10/06/17 [phone]
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
## 2017-10-06 NOTE — Progress Notes (Signed)
Physical Therapy Treatment Patient Details Name: Jillian Vaughn MRN: [id number] DOB: 05-Jul-1945 Today's Date: 10/06/2017    History of Present Illness Patient is a 72 year old female admitted following syncope and collapse.  PMH includes Parkinson's Disease, dementia and arthritis.    PT Comments    Initial attempt, pt being fed lunch. Second attempt, pt soundly sleeping, but awoken through several attempts of voice and touch. Pt agreeable to attempt PT. Pt required greater assist with all mobility today and unable to ambulate. Pt noted to have R lean mildly in sit and greater with stand with inability to correct independently. During stand pt notes dizziness followed by blank stare. Does respond to question regarding need to sit and assisted to sit. Pt demonstrates ability to coordinate BLE and BUE movement to include march and overhead reach while maintaining sit balance. Grip strength symmetrical. Pt falls soundly asleep immediately once supine. Continue PT to progress strength, endurance and balance to improve functional mobility. May require skilled nursing facility placement if continues to demonstrate decline in function/ambulation.    Follow Up Recommendations  Home health PT;Supervision for mobility/OOB;Other (comment)(made need SNF if unable to ambulate)     Equipment Recommendations       Recommendations for Other Services       Precautions / Restrictions Precautions Precautions: Fall Restrictions Weight Bearing Restrictions: No    Mobility  Bed Mobility Overal bed mobility: Needs Assistance Bed Mobility: Supine to Sit;Sit to Supine     Supine to sit: Modified independent (Device/Increase time) Sit to supine: Min assist   General bed mobility comments: Min A for LE returning to supine only  Transfers Overall transfer level: Needs assistance Equipment used: Rolling walker (2 wheeled) Transfers: Sit to/from Stand Sit to Stand: Min assist         General  transfer comment: R lean with haphazard grabbing at varioius places of rw to stand. Hand over hand assist to place on handgrips. Short stand tolerance with noted onset of dizziness followed by blank stare. Pt responsive when asked if pt needed to return to sit.   Ambulation/Gait             General Gait Details: unable this session   Stairs             Wheelchair Mobility    Modified Rankin (Stroke Patients Only)       Balance Overall balance assessment: Needs assistance Sitting-balance support: Feet supported;Bilateral upper extremity supported Sitting balance-Leahy Scale: Fair Sitting balance - Comments: occasional slight lean to R   Standing balance support: Bilateral upper extremity supported Standing balance-Leahy Scale: Poor Standing balance comment: Strong lean to the right with inability to correct independently. Requires A to maintain upright stand balance without lean                            Cognition Arousal/Alertness: Lethargic Behavior During Therapy: WFL for tasks assessed/performed Overall Cognitive Status: Within Functional Limits for tasks assessed                                        Exercises Other Exercises Other Exercises: Assessment of coordinated LE march, BUE overhead reach simultaneously and simultaneous grip strength. Assessed due to heavy R lateral lean/dizziness/stare/lethargy    General Comments        Pertinent Vitals/Pain Pain Assessment: No/denies pain  Home Living                      Prior Function            PT Goals (current goals can now be found in the care plan section) Progress towards PT goals: Not progressing toward goals - comment    Frequency    Min 2X/week      PT Plan Current plan remains appropriate    Co-evaluation              AM-PAC PT "6 Clicks" Daily Activity  Outcome Measure  Difficulty turning over in bed (including adjusting  bedclothes, sheets and blankets)?: A Little Difficulty moving from lying on back to sitting on the side of the bed? : A Little Difficulty sitting down on and standing up from a chair with arms (e.g., wheelchair, bedside commode, etc,.)?: Unable Help needed moving to and from a bed to chair (including a wheelchair)?: A Lot Help needed walking in hospital room?: A Lot Help needed climbing 3-5 steps with a railing? : Total 6 Click Score: 12    End of Session Equipment Utilized During Treatment: Gait belt Activity Tolerance: Patient limited by lethargy;Patient limited by fatigue Patient left: in bed;with call bell/phone within reach;with bed alarm set   PT Visit Diagnosis: Unsteadiness on feet (R26.81);Muscle weakness (generalized) (M62.81)     Time: 6294-7654 PT Time Calculation (min) (ACUTE ONLY): 20 min  Charges:  $Therapeutic Activity: 8-22 mins                      Larae Grooms, PTA 10/06/2017, 2:00 PM

## 2017-10-06 NOTE — Progress Notes (Signed)
Palliative team called nurse asking for daughters number.  They stated they were unable to reach her via phone d/t busy signal.  I tried the number and it worked.  They will try to reach family tomorrow.  I will try to get additional numbers from her husband when he returns this afternoon.

## 2017-10-06 NOTE — Progress Notes (Addendum)
Clinical Education officer, museum (CSW) contacted patient's daughter/ HPOA Beth this morning to discuss D/C plan. Per [name] she would like SNF placement for patient. CSW made daughter aware that the only SNF bed offer is [facility] will likely not approve SNF because PT is recommending home health. CSW discussed private pay options at SNF. Per daughter they can privately pay for SNF and accepted bed offer from [facility]. Per [facility] admissions coordinator at [facility] they will [facility] to get [facility] authorization and will accept patient under private pay.  CSW received a call from patient's daughter later this afternoon stating that she spoke with patient's husband and they want to take patient home. Per daughter they don't want to private pay for [facility]. Per daughter they want home health through [facility] and will hire more caregivers to come in. Daughter is also open to hospice if patient meet criteria. APS worker Nicki Reaper is aware of above. Per [facility] admissions coordinator at [facility] she has stated [facility] authorization and will follow up with family from home if [place] approves SNF. RN case manager is aware of above.   [employer], LCSW [phone]

## 2017-10-06 NOTE — Care Management (Signed)
RNCM following for discharge disposition.  It family does not pay out of pocket for SNF placement she will return home with home health services, and PCS services.

## 2017-10-07 DIAGNOSIS — R531 Weakness: Secondary | ICD-10-CM | POA: Diagnosis not present

## 2017-10-07 DIAGNOSIS — Z515 Encounter for palliative care: Secondary | ICD-10-CM

## 2017-10-07 DIAGNOSIS — Z7189 Other specified counseling: Secondary | ICD-10-CM

## 2017-10-07 DIAGNOSIS — R55 Syncope and collapse: Secondary | ICD-10-CM | POA: Diagnosis not present

## 2017-10-07 DIAGNOSIS — E43 Unspecified severe protein-calorie malnutrition: Secondary | ICD-10-CM

## 2017-10-07 LAB — GLUCOSE, CAPILLARY: GLUCOSE-CAPILLARY: 75 mg/dL (ref 70–99)

## 2017-10-07 MED ORDER — LORAZEPAM 2 MG/ML IJ SOLN
INTRAMUSCULAR | Status: AC
  Filled 2017-10-07: qty 1

## 2017-10-07 MED ORDER — LORAZEPAM 2 MG/ML IJ SOLN
1.0000 mg | Freq: Once | INTRAMUSCULAR | Status: AC
  Administered 2017-10-07: 1 mg via INTRAVENOUS

## 2017-10-07 NOTE — Care Management (Signed)
Palliative consult performed and recommendation for Hospice Home. It was later determined that patient did not meet criteria for hospice home.  It was determined late pm that patient would meet criteria to received hospice services at home.  Patient will discharge home 8.21.2019 and patient's daughter who lives out of state has been informed. Will determine equipment needs.  Anticipate need for ems transport

## 2017-10-07 NOTE — Progress Notes (Signed)
Physical Therapy Treatment Patient Details Name: Jillian Vaughn MRN: [id number] DOB: [birth year] Today's Date: 10/07/2017    History of Present Illness Patient is a 72 year old female admitted following syncope and collapse.  PMH includes Parkinson's Disease, dementia and arthritis.    PT Comments    Pt in bed.  Ready for session.  Vital were taken prior 174/82 P 88 O2 100.  To edge of bed with rail and increased time.  Once sitting requires supervision but no physical assist to maintain balance.  Pt stood with min a x 1 with assist for maintaining balance.  She is unable to stand unsupported.  Pt was able to increase her ambulation distance today to 110' but pt requires min/mod a x 1 due to balance.  She has very narrow BOS often stepping on her own feet.  Verbal cues needed to correct frequently during gait.  She does complain of some pain in R hip towards end of gait.  She requires varied assist from min to mod a at times to prevent falls.  She remained in recliner after session.  Pt demonstrated decreased gait quality, speed, narrow BOS with decreased step length and height.  She requires +1 skilled assist at all times for mobility static and dynamic.  Discussed with husband who seems to be primary caregiver.  He initially stated he does not help her walk at home but when questioned, he stated he is always there to help her.  He is not consistent with his recall and how much he helps her and is not receptive to education and will say "Oh yes, I do that" when the moment before she says she does it on her own.  Husband has dementia.  She does not seem to be at her baseline and requires increased assist.  Will change recommendations to SNF as she would benefit to increase her functional independence and safety while an appropriate and safe discharge plan is put in place for her.  Her risk for falling and injury is high at this time.     Follow Up Recommendations  SNF  - IF SNF is not available to her,  she will need 24 hour care for mobility skills as husband is not able to safely provide this for her.       Equipment Recommendations       Recommendations for Other Services       Precautions / Restrictions Precautions Precautions: Fall Restrictions Weight Bearing Restrictions: No    Mobility  Bed Mobility Overal bed mobility: Needs Assistance Bed Mobility: Supine to Sit     Supine to sit: Modified independent (Device/Increase time)        Transfers Overall transfer level: Needs assistance Equipment used: Rolling walker (2 wheeled) Transfers: Sit to/from Stand Sit to Stand: Min assist         General transfer comment: Verbal cues for safety and assist to maintain balance upon standing.  Ambulation/Gait Ambulation/Gait assistance: Min assist;Mod assist Gait Distance (Feet): 110 Feet Assistive device: Rolling walker (2 wheeled)     Gait velocity interpretation: <1.8 ft/sec, indicate of risk for recurrent falls     Stairs             Wheelchair Mobility    Modified Rankin (Stroke Patients Only)       Balance Overall balance assessment: Needs assistance Sitting-balance support: Feet supported;Bilateral upper extremity supported       Standing balance support: Bilateral upper extremity supported Standing balance-Leahy Scale: Poor  Standing balance comment: Unable to stand without assist.  High fall risk.                            Cognition Arousal/Alertness: Awake/alert Behavior During Therapy: WFL for tasks assessed/performed Overall Cognitive Status: Within Functional Limits for tasks assessed                                 General Comments: Follows commands consistently.      Exercises      General Comments        Pertinent Vitals/Pain Pain Assessment: Faces Faces Pain Scale: Hurts little more Pain Location: R leg towards end of gait. Pain Descriptors / Indicators: Sore;Guarding Pain  Intervention(s): Limited activity within patient's tolerance;Monitored during session    Home Living                      Prior Function            PT Goals (current goals can now be found in the care plan section) Progress towards PT goals: Progressing toward goals    Frequency    Min 2X/week      PT Plan Discharge plan needs to be updated;Other (comment)    Co-evaluation              AM-PAC PT "6 Clicks" Daily Activity  Outcome Measure  Difficulty turning over in bed (including adjusting bedclothes, sheets and blankets)?: A Little Difficulty moving from lying on back to sitting on the side of the bed? : A Little Difficulty sitting down on and standing up from a chair with arms (e.g., wheelchair, bedside commode, etc,.)?: Unable Help needed moving to and from a bed to chair (including a wheelchair)?: A Lot Help needed walking in hospital room?: A Lot Help needed climbing 3-5 steps with a railing? : Total 6 Click Score: 12    End of Session Equipment Utilized During Treatment: Gait belt Activity Tolerance: Patient tolerated treatment well;Patient limited by fatigue Patient left: in chair;with chair alarm set;with call bell/phone within reach;with family/visitor present Nurse Communication: Mobility status       Time: 3343-5686 PT Time Calculation (min) (ACUTE ONLY): 18 min  Charges:  $Gait Training: 8-22 mins                     Chesley Noon, PTA 10/07/17, 11:23 AM

## 2017-10-07 NOTE — Progress Notes (Signed)
[facility] at [facility]                                                                                                                                                                                  Patient Demographics   Makalah Asberry, is a 72 y.o. female, DOB - Jul 14, 1945, ZSW:[id number]  Admit date - 10/02/2017   Admitting Physician Saundra Shelling, MD  Outpatient Primary MD for the patient is Morayati, [name], MD   LOS - 0  Subjective: Patient is less responsive today.  Pitting less.,  Palliative care team, daughter agreed for hospice home, she needs to talk to patient's stepfather, likely discharge to hospice home tomorrow if everyone agrees. Review of Systems:   CONSTITUTIONAL: No documented fever. No fatigue, weakness. No weight gain  profound weight loss.  Appears cachectic. EYES: No blurry or double vision.  Appears cachectic ENT: No tinnitus. No postnasal drip. No redness of the oropharynx.  RESPIRATORY: No cough, no wheeze, no hemoptysis. No dyspnea.  CARDIOVASCULAR: No chest pain. No orthopnea. No palpitations. No syncope.  GASTROINTESTINAL: No nausea, no vomiting or diarrhea. No abdominal pain. No melena or hematochezia.  GENITOURINARY: No dysuria or hematuria.  ENDOCRINE: No polyuria or nocturia. No heat or cold intolerance.  HEMATOLOGY: No anemia. No bruising. No bleeding.  INTEGUMENTARY: No rashes. No lesions.  MUSCULOSKELETAL: No arthritis. No swelling. No gout.  NEUROLOGIC: No numbness, tingling, or ataxia. No seizure-type activity.  PSYCHIATRIC: No anxiety. No insomnia. No ADD.    Vitals:   Vitals:   10/07/17 0159 10/07/17 0427 10/07/17 0806 10/07/17 0950  BP:  (!) 166/90 (!) 148/99 (!) 174/84  Pulse:  75 84   Resp:   18   Temp:  (!) 97.5 F (36.4 C) 97.9 F (36.6 C)   TempSrc:  Oral Oral   SpO2:  100% 100%   Weight: 50.6 kg     Height:        Wt Readings from Last 3 Encounters:  10/07/17 50.6 kg  09/23/17 36.7 kg   09/17/17 36.8 kg     Intake/Output Summary (Last 24 hours) at 10/07/2017 1430 Last data filed at 10/07/2017 1344 Gross per 24 hour  Intake 2659.95 ml  Output 600 ml  Net 2059.95 ml    Physical Exam:   GENERAL: Pleasant-appearing in no apparent distress.   appears severely malnourished. HEAD, EYES, EARS, NOSE AND THROAT: Atraumatic, normocephalic. Extraocular muscles are intact. Pupils equal and reactive to light. Sclerae anicteric. No conjunctival injection. No oro-pharyngeal erythema.  NECK: Supple. There is no jugular venous distention. No bruits, no lymphadenopathy, no thyromegaly.  HEART: Regular rate and rhythm,. No murmurs, no rubs,  no clicks.  LUNGS: Clear to auscultation bilaterally. No rales or rhonchi. No wheezes.  ABDOMEN: Soft, flat, nontender, nondistended. Has good bowel sounds. No hepatosplenomegaly appreciated.  EXTREMITIES: No evidence of any cyanosis, clubbing, or peripheral edema.  +2 pedal and radial pulses bilaterally.  NEUROLOGIC: The patient is alert, awake, and oriented x3 with no focal motor or sensory deficits appreciated bilaterally.  SKIN: Moist and warm with no rashes appreciated.  Psych: Not anxious, depressed LN: No inguinal LN enlargement    Antibiotics   Anti-infectives (From admission, onward)   None      Medications   Scheduled Meds: . amantadine  100 mg Oral BID  . aspirin EC  325 mg Oral Daily  . carbidopa-levodopa  2 tablet Oral QID  . enoxaparin (LOVENOX) injection  30 mg Subcutaneous Q24H  . feeding supplement (ENSURE ENLIVE)  237 mL Oral BID BM  . lubiprostone  24 mcg Oral Q breakfast  . meloxicam  7.5 mg Oral Daily  . mirtazapine  7.5 mg Oral QHS  . multivitamin with minerals  1 tablet Oral Daily  . pravastatin  40 mg Oral QHS  . QUEtiapine  25 mg Oral Once  . rOPINIRole  0.5 mg Oral Daily  . sodium chloride flush  3 mL Intravenous Q12H  . [START ON 10/08/2017] Vitamin D (Ergocalciferol)  50,000 Units Oral Q Wed   Continuous  Infusions: . sodium chloride 75 mL/hr at 10/07/17 1056   PRN Meds:.acetaminophen **OR** acetaminophen, ondansetron **OR** ondansetron (ZOFRAN) IV, senna-docusate   Data Review:   Micro Results No results found for this or any previous visit (from the past 240 hour(s)).  Radiology Reports Dg Chest 1 View  Result Date: 10/04/2017 CLINICAL DATA:  Hypertension. EXAM: CHEST  1 VIEW COMPARISON:  None. FINDINGS: Cardiomediastinal silhouette is normal. Mediastinal contours appear intact. Calcific atherosclerotic disease of the aorta. There is no evidence of focal airspace consolidation, pleural effusion or pneumothorax. Hyperinflation of the lungs. Osseous structures are without acute abnormality. Soft tissues are grossly normal. IMPRESSION: Hyperinflation of the lungs. Otherwise no acute disease. Electronically Signed   By: Fidela Salisbury M.D.   On: 10/04/2017 17:13   Ct Head Wo Contrast  Result Date: 10/04/2017 CLINICAL DATA:  72 year old female with recent fall, increased lethargy today. EXAM: CT HEAD WITHOUT CONTRAST TECHNIQUE: Contiguous axial images were obtained from the base of the skull through the vertex without intravenous contrast. COMPARISON:  Head CT without contrast 10/02/2017 and earlier. FINDINGS: Brain: Cerebral volume is within normal limits for age. No midline shift, ventriculomegaly, mass effect, evidence of mass lesion, intracranial hemorrhage or evidence of cortically based acute infarction. Gray-white matter differentiation is within normal limits throughout the brain. Right inferior lentiform perivascular space (normal variant). No cortical encephalomalacia. Vascular: Calcified atherosclerosis at the skull base. No suspicious intracranial vascular hyperdensity. Skull: No acute osseous abnormality identified. Sinuses/Orbits: Visualized paranasal sinuses and mastoids are stable and well pneumatized. Other: No acute orbit or scalp soft tissue findings. IMPRESSION: Stable and  normal for age noncontrast CT appearance of the brain. Electronically Signed   By: Genevie Ann M.D.   On: 10/04/2017 18:15   Ct Head Wo Contrast  Result Date: 10/02/2017 CLINICAL DATA:  Unwitnessed fall. History of Parkinson's disease and dementia EXAM: CT HEAD WITHOUT CONTRAST TECHNIQUE: Contiguous axial images were obtained from the base of the skull through the vertex without intravenous contrast. COMPARISON:  September 23, 2017 FINDINGS: Brain: There is age related volume loss. There is no intracranial mass, hemorrhage,  extra-axial fluid collection, or midline shift. Gray-white compartments appear normal except for minimal periventricular small vessel disease. No evident acute infarct. Vascular: No hyperdense vessel. There are foci of calcification in each carotid siphon. Skull: Bony calvarium appears intact. Sinuses/Orbits: There is mucosal thickening in several ethmoid air cells. Other visualized paranasal sinuses are clear. Orbits appear symmetric bilaterally except for previous cataract removal on the right. Other: Mastoid air cells are clear. There is debris in each external auditory canal. IMPRESSION: Age related volume loss with minimal periventricular small vessel disease. No acute infarct. No mass or hemorrhage. Foci of arterial vascular calcification noted. There is mucosal thickening in several ethmoid air cells. There is probable cerumen in each external auditory canal. Electronically Signed   By: Lowella Grip III M.D.   On: 10/02/2017 14:35   Ct Head Wo Contrast  Result Date: 09/23/2017 CLINICAL DATA:  72 year old female tripped on rug and fell hitting head on tile floor. Was unconscious for short period of time. Initial encounter. EXAM: CT HEAD WITHOUT CONTRAST CT MAXILLOFACIAL WITHOUT CONTRAST TECHNIQUE: Multidetector CT imaging of the head and maxillofacial structures were performed using the standard protocol without intravenous contrast. Multiplanar CT image reconstructions of the  maxillofacial structures were also generated. COMPARISON:  09/17/2017. FINDINGS: CT HEAD FINDINGS Brain: No intracranial hemorrhage or CT evidence of large acute infarct. No intracranial mass lesion noted on this unenhanced exam. Mild atrophy. Prominent perivascular space versus remote lacunar infarct right lenticular nucleus. Vascular: Vascular calcifications Skull: No skull fracture Other: Negative CT MAXILLOFACIAL FINDINGS Osseous: No facial fracture. Orbits: No orbital floor fracture or primary orbital abnormality noted. Sinuses: Clear. Soft tissues: No worrisome soft tissue abnormality. Other: Cervical spondylotic changes C5-6 and C6-7. Visualized aspect of the cervical spine without fracture identified. IMPRESSION: 1. No skull fracture or intracranial hemorrhage. 2. No facial or orbital fracture. 3. Mild atrophy. Electronically Signed   By: Genia Del M.D.   On: 09/23/2017 14:34   Ct Head Wo Contrast  Result Date: 09/17/2017 CLINICAL DATA:  [place] in bathroom hit head on toilet EXAM: CT HEAD WITHOUT CONTRAST CT CERVICAL SPINE WITHOUT CONTRAST TECHNIQUE: Multidetector CT imaging of the head and cervical spine was performed following the standard protocol without intravenous contrast. Multiplanar CT image reconstructions of the cervical spine were also generated. COMPARISON:  CT 07/26/2017, 06/19/2017 FINDINGS: CT HEAD FINDINGS Brain: No acute territorial infarction, hemorrhage or intracranial mass is visualized. 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 Mild atrophy. Stable ventricle size. Vascular: No hyperdense vessels. Scattered calcification at the carotid siphon Skull: Normal. Negative for fracture or focal lesion. Sinuses/Orbits: No acute finding. Other: None CT CERVICAL SPINE FINDINGS Alignment: No subluxation.  Facet alignment within normal limits Skull base and vertebrae: No acute fracture. No primary bone lesion or focal pathologic process. Soft tissues and spinal canal: No prevertebral  fluid or swelling. No visible canal hematoma. Disc levels: Moderate degenerative changes at C5-C6 and mild to moderate degenerative changes at C6-C7. Upper chest: Negative. Other: None IMPRESSION: 1. No CT evidence for acute intracranial abnormality.  Mild atrophy. 2. Degenerative changes at C5-C6 and C6-C7. No acute osseous abnormality. Electronically Signed   By: Donavan Foil M.D.   On: 09/17/2017 19:39   Ct Cervical Spine Wo Contrast  Result Date: 09/17/2017 CLINICAL DATA:  [place] in bathroom hit head on toilet EXAM: CT HEAD WITHOUT CONTRAST CT CERVICAL SPINE WITHOUT CONTRAST TECHNIQUE: Multidetector CT imaging of the head and cervical spine was performed following the standard protocol without intravenous contrast.  Multiplanar CT image reconstructions of the cervical spine were also generated. COMPARISON:  CT 07/26/2017, 06/19/2017 FINDINGS: CT HEAD FINDINGS Brain: No acute territorial infarction, hemorrhage or intracranial mass is visualized. 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 Mild atrophy. Stable ventricle size. Vascular: No hyperdense vessels. Scattered calcification at the carotid siphon Skull: Normal. Negative for fracture or focal lesion. Sinuses/Orbits: No acute finding. Other: None CT CERVICAL SPINE FINDINGS Alignment: No subluxation.  Facet alignment within normal limits Skull base and vertebrae: No acute fracture. No primary bone lesion or focal pathologic process. Soft tissues and spinal canal: No prevertebral fluid or swelling. No visible canal hematoma. Disc levels: Moderate degenerative changes at C5-C6 and mild to moderate degenerative changes at C6-C7. Upper chest: Negative. Other: None IMPRESSION: 1. No CT evidence for acute intracranial abnormality.  Mild atrophy. 2. Degenerative changes at C5-C6 and C6-C7. No acute osseous abnormality. Electronically Signed   By: Donavan Foil M.D.   On: 09/17/2017 19:39   US Carotid Bilateral  Result Date: 10/03/2017 CLINICAL DATA:   Syncope and collapse.  History of hypertension. EXAM: BILATERAL CAROTID DUPLEX ULTRASOUND TECHNIQUE: Pearline Cables scale imaging, color Doppler and duplex ultrasound were performed of bilateral carotid and vertebral arteries in the neck. COMPARISON:  None. FINDINGS: Criteria: Quantification of carotid stenosis is based on velocity parameters that correlate the residual internal carotid diameter with NASCET-based stenosis levels, using the diameter of the distal internal carotid lumen as the denominator for stenosis measurement. The following velocity measurements were obtained: RIGHT ICA:  96/34 cm/sec CCA:  32/67 cm/sec SYSTOLIC ICA/CCA RATIO:  1.3 ECA:  131 cm/sec LEFT ICA:  82/30 cm/sec CCA:  12/45 cm/sec SYSTOLIC ICA/CCA RATIO:  1.3 ECA:  73 cm/sec RIGHT CAROTID ARTERY: There is a minimal amount of echogenic plaque within the right carotid bulb (images 15 and 17), not resulting in elevated peak systolic velocities within the interrogated course the right internal carotid artery to suggest a hemodynamically significant stenosis. RIGHT VERTEBRAL ARTERY:  Antegrade flow LEFT CAROTID ARTERY: There is a minimal amount of echogenic plaque within the left carotid bulb (images 48 and 50), not resulting in elevated peak systolic velocities within the interrogated course the left internal carotid artery to suggest a hemodynamically significant stenosis. LEFT VERTEBRAL ARTERY:  Antegrade flow IMPRESSION: Minimal amount of bilateral atherosclerotic plaque, not resulting in a hemodynamically significant stenosis within either internal carotid artery. Electronically Signed   By: Sandi Mariscal M.D.   On: 10/03/2017 09:56   Dg Chest Portable 1 View  Result Date: 10/02/2017 CLINICAL DATA:  Found at home on the floor. EXAM: PORTABLE CHEST 1 VIEW COMPARISON:  06/19/2017 FINDINGS: Heart size is normal. Chronic aortic atherosclerosis. The lungs are clear. The vascularity is normal. No effusions. Bilateral nipple shadows. No acute bone  finding. IMPRESSION: No active cardiopulmonary disease.  Aortic atherosclerosis. Electronically Signed   By: Nelson Chimes M.D.   On: 10/02/2017 14:49   Ct Maxillofacial Wo Contrast  Result Date: 09/23/2017 CLINICAL DATA:  72 year old female tripped on rug and fell hitting head on tile floor. Was unconscious for short period of time. Initial encounter. EXAM: CT HEAD WITHOUT CONTRAST CT MAXILLOFACIAL WITHOUT CONTRAST TECHNIQUE: Multidetector CT imaging of the head and maxillofacial structures were performed using the standard protocol without intravenous contrast. Multiplanar CT image reconstructions of the maxillofacial structures were also generated. COMPARISON:  09/17/2017. FINDINGS: CT HEAD FINDINGS Brain: No intracranial hemorrhage or CT evidence of large acute infarct. No intracranial mass lesion noted on this unenhanced exam. Mild  atrophy. Prominent perivascular space versus remote lacunar infarct right lenticular nucleus. Vascular: Vascular calcifications Skull: No skull fracture Other: Negative CT MAXILLOFACIAL FINDINGS Osseous: No facial fracture. Orbits: No orbital floor fracture or primary orbital abnormality noted. Sinuses: Clear. Soft tissues: No worrisome soft tissue abnormality. Other: Cervical spondylotic changes C5-6 and C6-7. Visualized aspect of the cervical spine without fracture identified. IMPRESSION: 1. No skull fracture or intracranial hemorrhage. 2. No facial or orbital fracture. 3. Mild atrophy. Electronically Signed   By: Genia Del M.D.   On: 09/23/2017 14:34     CBC Recent Labs  Lab 10/02/17 1249 10/03/17 0451  WBC 9.4 5.5  HGB 13.1 12.5  HCT 37.7 36.0  PLT 245 221  MCV 95.6 95.7  MCH 33.1 33.1  MCHC 34.7 34.6  RDW 13.2 13.3  LYMPHSABS 0.9*  --   MONOABS 0.8  --   EOSABS 0.0  --   BASOSABS 0.0  --     Chemistries  Recent Labs  Lab 10/02/17 1249 10/03/17 0451 10/04/17 0536  NA 141 143  --   K 4.2 3.6  --   CL 107 108  --   CO2 26 27  --   GLUCOSE 95 83   --   BUN 22 19  --   CREATININE 0.83 0.85  --   CALCIUM 9.4 8.6*  --   MG  --   --  2.0  AST 31  --   --   ALT <5  --   --   ALKPHOS 62  --   --   BILITOT 1.1  --   --    ------------------------------------------------------------------------------------------------------------------ estimated creatinine clearance is 46.4 mL/min (by C-G formula based on SCr of 0.85 mg/dL). ------------------------------------------------------------------------------------------------------------------ No results for input(s): HGBA1C in the last 72 hours. ------------------------------------------------------------------------------------------------------------------ No results for input(s): CHOL, HDL, LDLCALC, TRIG, CHOLHDL, LDLDIRECT in the last 72 hours. ------------------------------------------------------------------------------------------------------------------ No results for input(s): TSH, T4TOTAL, T3FREE, THYROIDAB in the last 72 hours.  Invalid input(s): FREET3 ------------------------------------------------------------------------------------------------------------------ No results for input(s): VITAMINB12, FOLATE, FERRITIN, TIBC, IRON, RETICCTPCT in the last 72 hours.  Coagulation profile No results for input(s): INR, PROTIME in the last 168 hours.  No results for input(s): DDIMER in the last 72 hours.  Cardiac Enzymes Recent Labs  Lab 10/02/17 1720 10/02/17 2251 10/03/17 0451  TROPONINI <0.03 <0.03 <0.03   ------------------------------------------------------------------------------------------------------------------ Invalid input(s): POCBNP    Assessment & Plan   72 year old female patient with history of dementia, Parkinson's disease, arthritis presented to emergency room after passing out  -Syncope and collapse This is related to orthostatic hypotension possibly autonomic dysfuction, as well as severe malnutrition.    -Dehydration  encourage p.o. intake,   and p.o. intake is still poor secondary to dementia, -Advanced dementia Supportive care, because of recurrent ER visits, patient was seen 9 times in the last 6 months, admitted one time in the last 6 months and patient husband also has dementia, there is a question that patient is not getting enough care that she requires with evidence of malnourishment, sister, daughter requesting placement . p spoke with palliative care team, daughter is agreeable for hospice home placement.  Still waiting on further family members decision, likely discharge to hospice home tomorrow.  -Parkinson's disease Supportive care Continue  Sinemet and amantadine Discontinue tele sitter as per social worker bailey recommendation for her to qualify for skilled nursing facility placement.     Code Status Orders  (From admission, onward)         Start  Ordered   10/02/17 1700  Full code  Continuous     10/02/17 1659        Code Status History    This patient has a current code status but no historical code status.    Advance Directive Documentation     Most Recent Value  Type of Advance Directive  Healthcare Power of Attorney, Living will  Pre-existing out of facility DNR order (yellow form or pink MOST form)  -  "MOST" Form in Place?  -    DNR       Consults none  DVT Prophylaxis  Lovenox   Lab Results  Component Value Date   PLT 221 10/03/2017     Time Spent in minutes   7min  Greater than 50% of time spent in care coordination and counseling patient regarding the condition and plan of care.   Epifanio Lesches M.D on 10/07/2017 at 2:30 PM  Between 7am to 6pm - Pager - [phone]  After 6pm go to [email or web address] - Proofreader  [employer]   Office  [phone]

## 2017-10-08 NOTE — Care Management (Signed)
Spoke with patient's husband and agency preference for hospice services at home is [facility]. Patient's husband says there is no need for hospital bed because "we have a bed." CM spoke with patient's daughter [name]. Discussed discharge today.   She says a hospital bed would be needed and asks if it could wait until she arrives on Monday so furniture can be rearranged.  Has concern about the chair in the kitchen where the patient would take her meals. It does not have arm rests and patient has fallen out of it to the side. Will need a bsc and maybe a wheelchair.  Caregiver support will be increased to 4 hours a day from a private agency. [name] is agreeable to ems transport home

## 2017-10-08 NOTE — Progress Notes (Signed)
Patient is being discharge home with hospice care, EMS is transporting patient , patient husband was notified that patient has been pick up by EMS at this time , patient Iv was removed and tele removed prior to transport.

## 2017-10-08 NOTE — Consult Note (Signed)
Consultation Note Date: 10/08/2017   Patient Name: Jillian Vaughn  DOB: Jul 21, 1945  MRN: [id number]  Age / Sex: 72 y.o., female  PCP: Lenard Simmer, MD Referring Physician: Epifanio Lesches, MD  Reason for Consultation: Establishing goals of care  HPI/Patient Profile: 72 y.o. female admitted on 10/02/2017 from home with husband with complaints of fall s/p syncopal episode. She has a past medical history significant for Parkinson's disease, dementia, and arthritis. During ED course patient's husband reported that patient passed out around 1130 in the home. She has dementia and eas unable to provide much history. She was awake and responsive to some verbal commands. Troponin was negative and head CT showed no acute abnormality. Since admission patient continues on home medications and receiving IV fluids for dehydration. She had a near syncopal episode and became pale and cold on 8/17 and became lethargic. She continues to have a poor appetite. Palliative Medicine team consulted for goals of care discussion.   Clinical Assessment and Goals of Care: I have reviewed medical records including lab results, imaging, Epic notes, and MAR, received report from the bedside RN, and assessed the patient. I then had a discussion with daughter, Jillian Vaughn [facility]) and met with husband to discuss diagnosis prognosis, GOC, EOL wishes, disposition and options. Husband has dementia but is able to function and continues to drive. Daughter, Jillian Vaughn lives in [place]. Patient has history of dementia. She is somewhat lethargic. Easily aroused when name called but drifts back off. She was able to deny pain or discomfort at this time. She is unable to engage appropriately in goals of care discussion.   I introduced Palliative Medicine as specialized medical care for people living with serious illness. It focuses on providing relief from the  symptoms and stress of a serious illness. The goal is to improve quality of life for both the patient and the family.    As far as functional and nutritional status, daughter reports her father has dementia, however, he is functional and still drives. Patient currently lives at home with him and they have [facility] that comes in several times a week to assist with home care needs Daughter states she did have [facility] services in the home for almost 2 months and during their time they provided HHPT/OT/RN services. She reports her mother is ambulatory with assistance. Unfortunately she has gotten weaker over the past 3-4 months and continues to have falls due to balance and instability. She showed some improvement when working with home health. She reports her father and home care workers state that Mrs. Jillian Vaughn's appetite has decreased over the past week and she would only take min. Bites if any despite being prompted. She feels that her care is becoming to much for her father to handle and that patient is showing signs of decline and more progression of her dementia.    We discussed her current illness and what it means in the larger context of her on-going co-morbidities.  Natural disease trajectory and expectations at EOL were  discussed. We discussed in details progression of dementia. Daughter verbalizes that her brother and other family members who live closer are aware of patient's decline over the past months and are aware that her time may be limited. Daughter verbalizes patient not showing interest in things anymore and also sleeping more than usual.   I attempted to elicit values and goals of care important to the patient and the family.   The difference between aggressive medical intervention and comfort care was considered in light of the patient's goals of care. At this time daughter is aware of patient's decline and would like to continue to treat the treatable without escalation of care. She  confirms that patient is a DNR/DNI and that she has a living will which expresses no forms of life sustaining measures including PEG tube. Daughter is concerned with patient having multiple ER visits and her not showing any interest in eating.  She verbalizes that although PT recommends PT she is unsure that her mother would be able to participate in her current state and if so how long if she would. She reports based on reports from health care workers, her father, and other family members, patient has good days and bad days. It seems to be more bad days over the past few months than good, unfortunately according to daughter.    Hospice and Palliative Care services outpatient were explained and offered. Daughter reports she has called in her local area and done some research and feels that hospice services would be most beneficial to patient if she is accepted. She would like for her mother to be placed at residential hospice with awareness that she would not receive aggressive measures and would receive symptom focused care and allow her to be comfortable.   Questions and concerns were addressed. The family was encouraged to call with questions or concerns.  PMT will continue to support holistically.  ADDENDUM:  After having discussion with daughter and requesting residential hospice facility for comfort measures, patient was able to participate with PT. According to PT notes patient was able to ambulate 152f with with min x1 assistance. She was able to stand unsupported. She tolerated with verbal cues. Patient also ate a good portion of her lunch 40% per staff and husband. At this time given patient's increase in alertness and ability to show signs of activity she is not a candidate for residential hospice. I have received noticed from KSantiago Glad RAldine([facility] and also from BSabetha CSiesta Acresthat patient's daughter will need to re-evaluate disposition options. I discussed with daughter extensively patients  alertness and activity level when working with PT. Daughter verbalized her gratefulness but also frustration and emotional state given patient may be doing well for the moment but she has a trend of rallying only to repeat her pattern of not doing these things. She verbalized understanding and appreciation of updates, and she is planning to contact home care agency first thing in the morning to have them set-up and coming out into the home. Daughter would like for patient to return home with private pay caregivers and outpatient hospice if approved. If not approved Palliative services at minimum. Daughter is making arrangements with her employer to take some time off and fly in the end of this week to evaluate and spend time with her parents.   Primary Decision Maker:  HCPOA-Beth Daye, Daughter   SUMMARY OF RECOMMENDATIONS    DNR/DNI- as confirmed by daughter/POA  Continue to treat the treatable while hospitalized without  escalation of care.   Daughter is aware of patient's current status and inability to be approved for residential hospice given she is not showing signs of significant decline or life expectancy of weeks to days. She is planning to arrange home care services to assist her father in the home and to come home herself and spend some time.   Santiago Glad, RN [facility] Liaison), Mel Almond, CSW, and Dr. Vianne Bulls aware of disposition changes. Daughter is requesting outpatient hospice support.   Case Manager consult placed for outpatient hospice. Santiago Glad, RN aware.   Palliative will continue to support patient, family, and medical team as needed during hospitalization.   Code Status/Advance Care Planning:  DNR/DNI-as confirmed by daughter (POA)  Palliative Prophylaxis:   Aspiration, Bowel Regimen, Delirium Protocol, Frequent Pain Assessment and Oral Care  Additional Recommendations (Limitations, Scope, Preferences):  Full Scope Treatment-continue to treat the treatable without escalation of  care.   Psycho-social/Spiritual:   Desire for further Chaplaincy support:NO   Prognosis:   Guarded to poor in the setting of decreased mobility and strength, dementia, arthritis, Parkinson's, poor po intake, protein calorie malnutrition, dehydration, and, recurrent falls.  Discharge Planning: Home with Home Health and outpatient hospice.      Primary Diagnoses: Present on Admission: . Syncope and collapse   I have reviewed the medical record, interviewed the patient and family, and examined the patient. The following aspects are pertinent.  Past Medical History:  Diagnosis Date  . Arthritis   . Dementia   . Parkinson disease [facility])    Social History   Socioeconomic History  . Marital status: Married    Spouse name: Lona Six  . Number of children: 3  . Years of education: Unknown  . Highest education level: Not on file  Occupational History  . Not on file  Social Needs  . Financial resource strain: Not very hard  . Food insecurity:    Worry: Never true    Inability: Never true  . Transportation needs:    Medical: No    Non-medical: No  Tobacco Use  . Smoking status: Never Smoker  . Smokeless tobacco: Never Used  Substance and Sexual Activity  . Alcohol use: Yes  . Drug use: No  . Sexual activity: Not on file  Lifestyle  . Physical activity:    Days per week: Not on file    Minutes per session: Not on file  . Stress: Not on file  Relationships  . Social connections:    Talks on phone: Once a week    Gets together: Twice a week    Attends religious service: Never    Active member of club or organization: No    Attends meetings of clubs or organizations: Never    Relationship status: Married  Other Topics Concern  . Not on file  Social History Narrative  . Not on file   History reviewed. No pertinent family history. Scheduled Meds: . amantadine  100 mg Oral BID  . aspirin EC  325 mg Oral Daily  . carbidopa-levodopa  2 tablet Oral QID  .  enoxaparin (LOVENOX) injection  30 mg Subcutaneous Q24H  . feeding supplement (ENSURE ENLIVE)  237 mL Oral BID BM  . lubiprostone  24 mcg Oral Q breakfast  . meloxicam  7.5 mg Oral Daily  . mirtazapine  7.5 mg Oral QHS  . multivitamin with minerals  1 tablet Oral Daily  . pravastatin  40 mg Oral QHS  . QUEtiapine  25 mg Oral Once  .  rOPINIRole  0.5 mg Oral Daily  . sodium chloride flush  3 mL Intravenous Q12H  . Vitamin D (Ergocalciferol)  50,000 Units Oral Q Wed   Continuous Infusions: . sodium chloride 75 mL/hr at 10/07/17 1928   PRN Meds:.acetaminophen **OR** acetaminophen, ondansetron **OR** ondansetron (ZOFRAN) IV, senna-docusate Medications Prior to Admission:  Prior to Admission medications   Medication Sig Start Date End Date Taking? Authorizing Provider  acetaminophen (TYLENOL) 650 MG CR tablet Take 1,300 mg by mouth 2 (two) times daily as needed for pain.   Yes [provider]  amantadine (SYMMETREL) 100 MG capsule Take 100 mg by mouth 2 (two) times daily. 03/23/15  Yes [provider]  aspirin 325 MG tablet Take 325 mg by mouth daily.   Yes [provider]  carbidopa-levodopa (SINEMET) 25-100 MG tablet Take 2 tablets by mouth 4 (four) times daily.  03/02/15  Yes [provider]  lubiprostone (AMITIZA) 24 MCG capsule Take 24 mcg by mouth daily with breakfast.   Yes [provider]  meloxicam (MOBIC) 7.5 MG tablet Take 7.5 mg by mouth daily.   Yes [provider]  mirtazapine (REMERON) 7.5 MG tablet Take 7.5 mg by mouth at bedtime.   Yes [provider]  pravastatin (PRAVACHOL) 40 MG tablet Take 40 mg by mouth at bedtime.   Yes [provider]  Riboflavin (VITAMIN B-2) 50 MG TABS Take 50 mg by mouth daily.   Yes [provider]  rOPINIRole (REQUIP) 0.5 MG tablet Take 0.5 mg by mouth daily.   Yes [provider]  Vitamin D, Ergocalciferol, (DRISDOL) 50000 units CAPS capsule Take 50,000 Units by  mouth every Wednesday. 04/18/15  Yes [provider]  feeding supplement, ENSURE ENLIVE, (ENSURE ENLIVE) LIQD Take 237 mLs by mouth 2 (two) times daily between meals. 10/04/17   Epifanio Lesches, MD  midodrine (PROAMATINE) 10 MG tablet Take 1 tablet (10 mg total) by mouth 3 (three) times daily with meals. 10/04/17   Epifanio Lesches, MD   No Known Allergies Review of Systems  Neurological: Positive for weakness.  All other systems reviewed and are negative.   Physical Exam  Constitutional: She appears ill.  Thin and frail in appearance.   Cardiovascular: Normal rate, regular rhythm and normal pulses.  Pulmonary/Chest: Effort normal. She has decreased breath sounds.  Abdominal: Soft. Normal appearance and bowel sounds are normal.  Musculoskeletal:  Generalized weakness   Neurological: She is alert. She is disoriented.  Skin: Skin is warm and dry. Bruising noted.  Psychiatric: Cognition and memory are impaired. She expresses inappropriate judgment.  Nursing note and vitals reviewed.   Vital Signs: BP 130/72 (BP Location: Left Arm)   Pulse 72   Temp 97.7 F (36.5 C) (Oral)   Resp 14   Ht '5\' 5"'  (1.651 m)   Wt 35.4 kg   SpO2 99%   BMI 12.98 kg/m  Pain Scale: PAINAD   Pain Score: 0-No pain   SpO2: SpO2: 99 % O2 Device:SpO2: 99 % O2 Flow Rate: .   IO: Intake/output summary:   Intake/Output Summary (Last 24 hours) at 10/08/2017 1256 Last data filed at 10/07/2017 2223 Gross per 24 hour  Intake 210 ml  Output 850 ml  Net -640 ml    LBM: Last BM Date: 10/05/17 Baseline Weight: Weight: 38.6 kg Most recent weight: Weight: 35.4 kg     Palliative Assessment/Data: PPS 40 % decreased appetite    Time In: 1100    Time In: 1630 Time  Out: 1215  Time Out: 1515 Time Total: 120 min.   Greater than 50%  of this time was spent counseling and coordinating care related to the above assessment and plan.  Signed by:  Alda Lea, NP-BC Palliative  Medicine Team  Phone: [phone] Fax: [phone] Pager: [phone] Amion: Bjorn Pippin    Please contact Palliative Medicine Team phone at [phone] for questions and concerns.  For individual provider: See Shea Evans

## 2017-10-08 NOTE — Discharge Instructions (Signed)
Hospice services at home

## 2017-10-08 NOTE — Progress Notes (Signed)
New referral for [facility] services at home received from [place] following a Palliative medicine consult. Patient is a 72 year old woman with a history of Parkinson's, dementia and arthritis admitted to [facility] on 8/15 for treatment of a syncopal episode at home, of note she has had 9 ED visits in the last 6 months. She lives with her husband and has part time hired care in the home. Patient has been reported to have a very poor appetite over the past few weeks, no weight loss noted in chart review. Dietitian note reports severe  Malnutrition related to chronic illness as evidenced by severe fat and muscle depletion. Palliative NP Benito Mccreedy met with patient and her husband on 8/20 and spoke with her daughter [name] via telephone. They have chosen for patient to return home with the support of hospice services. Patient seen sitting up in bed, sister Jacqlyn Larsen at bedside. Patient alert and able to greet writer, able to answer simple questions. Breakfast tray at bedside, patient appears to have eaten several bites. She stated she was glad to be going home. Writer spoke generally of hospice services with Jacqlyn Larsen, contact information and phone number given. Writer then spoke over the telephone with patient's daughter/HCPOA [name], who lives in [place], to initiate education regarding hospice services, philosophy and team approach to care with understanding voiced. Plan for admission to services made. Beth expressed her concern regarding Mr. Prices memory issues, referral to contact both Mr. Bastyr and [name] regarding admission time. Plan is for increased hired caregiver time in the home per [place]. Patient information faxed to referral. Patient tot discharge home via EMS today, signed DNR to accompany her. Hospital Care team updated. Thank you for the opportunity to be involved in the care of this patient. Flo Shanks RN, BSN, [place] and [facility], hospital  Liaison [phone]

## 2017-10-08 NOTE — Discharge Summary (Signed)
Jillian Vaughn, is a 72 y.o. female  DOB January 28, 1946  MRN [id number].  Admission date:  10/02/2017  Admitting Physician  Saundra Shelling, MD  Discharge Date:  10/08/2017   Primary MD  Lenard Simmer, MD  Recommendations for primary care physician for things to follow:   Home with hospice today   Admission Diagnosis  Syncope and collapse [R55]   Discharge Diagnosis  Syncope and collapse [R55]    Active Problems:   Syncope and collapse   Protein-calorie malnutrition, severe      Past Medical History:  Diagnosis Date  . Arthritis   . Dementia   . Parkinson disease [facility])     Past Surgical History:  Procedure Laterality Date  . APPENDECTOMY    . ESOPHAGOGASTRODUODENOSCOPY (EGD) WITH PROPOFOL N/A 06/19/2015   Procedure: ESOPHAGOGASTRODUODENOSCOPY (EGD) WITH PROPOFOL;  Surgeon: Lollie Sails, MD;  Location: [facility] ENDOSCOPY;  Service: Endoscopy;  Laterality: N/A;       History of present illness and  Hospital Course:     Kindly see H&P for history of present illness and admission details, please review complete Labs, Consult reports and Test reports for all details in brief  HPI  from the history and physical done on the day of admission 72 year old female patient with history of for Parkinson disease, dementia came in because of syncope, fall, admitted for syncope and fall found to have a severe orthostatic hypotension.   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  Patient blood pressure is better after starting Midodrin,   2.  Severe Parkinson's disease, dementia, patient is on multiple medications including Sinemet, Requip, Seroquel, Remeron, Symmetrel.  Patient has severe dementia, severe malnutrition so requested a palliative care consult to see if patient can go to hospice home.,  Patient walked around 100 feet yesterday with therapy, so disqualified to go to hospice home so she needs to go home with hospice services.,  [place] patient's daughter Jillian Vaughn updated her about this.  Patient is stable for discharge today.  Severe malnutrition: Continue Ensure supplements patient is on dysphagia 3 diet, Magic cups 3 times daily with meals, multivitamins.  Patient needs lots of help with her meals.  Will arrange some home health.  Patient has been also has severe dementia and she lives with husband.    Discharge Condition: Stable   Follow UP   Contact information for follow-up providers    Morayati, [name], MD. Schedule an appointment as soon as possible for a visit on 10/13/2017.   Specialty:  Endocrinology Why:  Appointment Time:@ 9:30am Office requesting notes of testing and admit notes faxed to [phone] Contact information: [place] [address] [phone]            Contact information for after-discharge care    [facility] .   Service:  Skilled Nursing Contact information: [address] [place] [phone]                    Discharge Instructions  and  Discharge Medications      Allergies as of 10/08/2017   No Known Allergies  Medication List    TAKE these medications   acetaminophen 650 MG CR tablet Commonly known as:  TYLENOL Take 1,300 mg by mouth 2 (two) times daily as needed for pain.   amantadine 100 MG capsule Commonly known as:  SYMMETREL Take 100 mg by mouth 2 (two) times daily.   aspirin 325 MG  tablet Take 325 mg by mouth daily.   feeding supplement (ENSURE ENLIVE) Liqd Take 237 mLs by mouth 2 (two) times daily between meals.   lubiprostone 24 MCG capsule Commonly known as:  AMITIZA Take 24 mcg by mouth daily with breakfast.   meloxicam 7.5 MG tablet Commonly known as:  MOBIC Take 7.5 mg by mouth daily.   midodrine 10 MG tablet Commonly known as:  PROAMATINE Take 1 tablet (10 mg total) by mouth 3 (three) times daily with meals.   mirtazapine 7.5 MG tablet Commonly known as:  REMERON Take 7.5 mg by mouth at bedtime.   pravastatin 40 MG tablet Commonly known as:  PRAVACHOL Take 40 mg by mouth at bedtime.   REQUIP 0.5 MG tablet Generic drug:  rOPINIRole Take 0.5 mg by mouth daily.   SINEMET 25-100 MG tablet Generic drug:  carbidopa-levodopa Take 2 tablets by mouth 4 (four) times daily.   Vitamin B-2 50 MG Tabs Take 50 mg by mouth daily.   Vitamin D (Ergocalciferol) 50000 units Caps capsule Commonly known as:  DRISDOL Take 50,000 Units by mouth every Wednesday.         Diet and Activity recommendation: See Discharge Instructions above   Consults obtained -palliative care, physical therapy, dietitian   Major procedures and Radiology Reports - PLEASE review detailed and final reports for all details, in brief -      Dg Chest 1 View  Result Date: 10/04/2017 CLINICAL DATA:  Hypertension. EXAM: CHEST  1 VIEW COMPARISON:  None. FINDINGS: Cardiomediastinal silhouette is normal. Mediastinal contours appear intact. Calcific atherosclerotic disease of the aorta. There is no evidence of focal airspace consolidation, pleural effusion or pneumothorax. Hyperinflation of the lungs. Osseous structures are without acute abnormality. Soft tissues are grossly normal. IMPRESSION: Hyperinflation of the lungs. Otherwise no acute disease. Electronically Signed   By: Fidela Salisbury M.D.   On: 10/04/2017 17:13   Ct Head Wo Contrast  Result Date: 10/04/2017 CLINICAL  DATA:  72 year old female with recent fall, increased lethargy today. EXAM: CT HEAD WITHOUT CONTRAST TECHNIQUE: Contiguous axial images were obtained from the base of the skull through the vertex without intravenous contrast. COMPARISON:  Head CT without contrast 10/02/2017 and earlier. FINDINGS: Brain: Cerebral volume is within normal limits for age. No midline shift, ventriculomegaly, mass effect, evidence of mass lesion, intracranial hemorrhage or evidence of cortically based acute infarction. Gray-white matter differentiation is within normal limits throughout the brain. Right inferior lentiform perivascular space (normal variant). No cortical encephalomalacia. Vascular: Calcified atherosclerosis at the skull base. No suspicious intracranial vascular hyperdensity. Skull: No acute osseous abnormality identified. Sinuses/Orbits: Visualized paranasal sinuses and mastoids are stable and well pneumatized. Other: No acute orbit or scalp soft tissue findings. IMPRESSION: Stable and normal for age noncontrast CT appearance of the brain. Electronically Signed   By: Genevie Ann M.D.   On: 10/04/2017 18:15   Ct Head Wo Contrast  Result Date: 10/02/2017 CLINICAL DATA:  Unwitnessed fall. History of Parkinson's disease and dementia EXAM: CT HEAD WITHOUT CONTRAST TECHNIQUE: Contiguous axial images were obtained from the base of the skull through the vertex without intravenous contrast. COMPARISON:  September 23, 2017 FINDINGS: Brain: There is age related volume loss. There is no intracranial mass, hemorrhage, extra-axial fluid collection, or midline shift. Gray-white compartments appear normal except for minimal periventricular small vessel disease. No evident acute infarct. Vascular: No hyperdense vessel. There are foci of calcification in each carotid siphon. Skull: Bony calvarium appears intact. Sinuses/Orbits: There is mucosal thickening in several ethmoid air cells. Other visualized paranasal sinuses are clear. Orbits appear  symmetric bilaterally except for previous cataract removal on the right. Other: Mastoid air cells are clear. There is debris in each external auditory canal. IMPRESSION: Age related volume loss with minimal periventricular small vessel disease. No acute infarct. No mass or hemorrhage. Foci of arterial vascular calcification noted. There is mucosal thickening in several ethmoid air cells. There is probable cerumen in each external auditory canal. Electronically Signed   By: Lowella Grip III M.D.   On: 10/02/2017 14:35   Ct Head Wo Contrast  Result Date: 09/23/2017 CLINICAL DATA:  72 year old female tripped on rug and fell hitting head on tile floor. Was unconscious for short period of time. Initial encounter. EXAM: CT HEAD WITHOUT CONTRAST CT MAXILLOFACIAL WITHOUT CONTRAST TECHNIQUE: Multidetector CT imaging of the head and maxillofacial structures were performed using the standard protocol without intravenous contrast. Multiplanar CT image reconstructions of the maxillofacial structures were also generated. COMPARISON:  09/17/2017. FINDINGS: CT HEAD FINDINGS Brain: No intracranial hemorrhage or CT evidence of large acute infarct. No intracranial mass lesion noted on this unenhanced exam. Mild atrophy. Prominent perivascular space versus remote lacunar infarct right lenticular nucleus. Vascular: Vascular calcifications Skull: No skull fracture Other: Negative CT MAXILLOFACIAL FINDINGS Osseous: No facial fracture. Orbits: No orbital floor fracture or primary orbital abnormality noted. Sinuses: Clear. Soft tissues: No worrisome soft tissue abnormality. Other: Cervical spondylotic changes C5-6 and C6-7. Visualized aspect of the cervical spine without fracture identified. IMPRESSION: 1. No skull fracture or intracranial hemorrhage. 2. No facial or orbital fracture. 3. Mild atrophy. Electronically Signed   By: Genia Del M.D.   On: 09/23/2017 14:34   Ct Head Wo Contrast  Result Date: 09/17/2017 CLINICAL  DATA:  [place] in bathroom hit head on toilet EXAM: CT HEAD WITHOUT CONTRAST CT CERVICAL SPINE WITHOUT CONTRAST TECHNIQUE: Multidetector CT imaging of the head and cervical spine was performed following the standard protocol without intravenous contrast. Multiplanar CT image reconstructions of the cervical spine were also generated. COMPARISON:  CT 07/26/2017, 06/19/2017 FINDINGS: CT HEAD FINDINGS Brain: No acute territorial infarction, hemorrhage or intracranial mass is visualized. 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 Mild atrophy. Stable ventricle size. Vascular: No hyperdense vessels. Scattered calcification at the carotid siphon Skull: Normal. Negative for fracture or focal lesion. Sinuses/Orbits: No acute finding. Other: None CT CERVICAL SPINE FINDINGS Alignment: No subluxation.  Facet alignment within normal limits Skull base and vertebrae: No acute fracture. No primary bone lesion or focal pathologic process. Soft tissues and spinal canal: No prevertebral fluid or swelling. No visible canal hematoma. Disc levels: Moderate degenerative changes at C5-C6 and mild to moderate degenerative changes at C6-C7. Upper chest: Negative. Other: None IMPRESSION: 1. No CT evidence for acute intracranial abnormality.  Mild atrophy. 2. Degenerative changes at C5-C6 and C6-C7. No acute osseous abnormality. Electronically Signed   By: Donavan Foil M.D.   On: 09/17/2017 19:39   Ct Cervical Spine Wo Contrast  Result Date: 09/17/2017 CLINICAL DATA:  [place] in bathroom hit head on toilet EXAM: CT HEAD WITHOUT CONTRAST CT CERVICAL SPINE WITHOUT CONTRAST TECHNIQUE: Multidetector CT imaging  of the head and cervical spine was performed following the standard protocol without intravenous contrast. Multiplanar CT image reconstructions of the cervical spine were also generated. COMPARISON:  CT 07/26/2017, 06/19/2017 FINDINGS: CT HEAD FINDINGS Brain: No acute territorial infarction, hemorrhage or intracranial mass is  visualized. 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 Mild atrophy. Stable ventricle size. Vascular: No hyperdense vessels. Scattered calcification at the carotid siphon Skull: Normal. Negative for fracture or focal lesion. Sinuses/Orbits: No acute finding. Other: None CT CERVICAL SPINE FINDINGS Alignment: No subluxation.  Facet alignment within normal limits Skull base and vertebrae: No acute fracture. No primary bone lesion or focal pathologic process. Soft tissues and spinal canal: No prevertebral fluid or swelling. No visible canal hematoma. Disc levels: Moderate degenerative changes at C5-C6 and mild to moderate degenerative changes at C6-C7. Upper chest: Negative. Other: None IMPRESSION: 1. No CT evidence for acute intracranial abnormality.  Mild atrophy. 2. Degenerative changes at C5-C6 and C6-C7. No acute osseous abnormality. Electronically Signed   By: Donavan Foil M.D.   On: 09/17/2017 19:39   US Carotid Bilateral  Result Date: 10/03/2017 CLINICAL DATA:  Syncope and collapse.  History of hypertension. EXAM: BILATERAL CAROTID DUPLEX ULTRASOUND TECHNIQUE: Pearline Cables scale imaging, color Doppler and duplex ultrasound were performed of bilateral carotid and vertebral arteries in the neck. COMPARISON:  None. FINDINGS: Criteria: Quantification of carotid stenosis is based on velocity parameters that correlate the residual internal carotid diameter with NASCET-based stenosis levels, using the diameter of the distal internal carotid lumen as the denominator for stenosis measurement. The following velocity measurements were obtained: RIGHT ICA:  96/34 cm/sec CCA:  29/52 cm/sec SYSTOLIC ICA/CCA RATIO:  1.3 ECA:  131 cm/sec LEFT ICA:  82/30 cm/sec CCA:  84/13 cm/sec SYSTOLIC ICA/CCA RATIO:  1.3 ECA:  73 cm/sec RIGHT CAROTID ARTERY: There is a minimal amount of echogenic plaque within the right carotid bulb (images 15 and 17), not resulting in elevated peak systolic velocities within the interrogated course  the right internal carotid artery to suggest a hemodynamically significant stenosis. RIGHT VERTEBRAL ARTERY:  Antegrade flow LEFT CAROTID ARTERY: There is a minimal amount of echogenic plaque within the left carotid bulb (images 48 and 50), not resulting in elevated peak systolic velocities within the interrogated course the left internal carotid artery to suggest a hemodynamically significant stenosis. LEFT VERTEBRAL ARTERY:  Antegrade flow IMPRESSION: Minimal amount of bilateral atherosclerotic plaque, not resulting in a hemodynamically significant stenosis within either internal carotid artery. Electronically Signed   By: Sandi Mariscal M.D.   On: 10/03/2017 09:56   Dg Chest Portable 1 View  Result Date: 10/02/2017 CLINICAL DATA:  Found at home on the floor. EXAM: PORTABLE CHEST 1 VIEW COMPARISON:  06/19/2017 FINDINGS: Heart size is normal. Chronic aortic atherosclerosis. The lungs are clear. The vascularity is normal. No effusions. Bilateral nipple shadows. No acute bone finding. IMPRESSION: No active cardiopulmonary disease.  Aortic atherosclerosis. Electronically Signed   By: Nelson Chimes M.D.   On: 10/02/2017 14:49   Ct Maxillofacial Wo Contrast  Result Date: 09/23/2017 CLINICAL DATA:  72 year old female tripped on rug and fell hitting head on tile floor. Was unconscious for short period of time. Initial encounter. EXAM: CT HEAD WITHOUT CONTRAST CT MAXILLOFACIAL WITHOUT CONTRAST TECHNIQUE: Multidetector CT imaging of the head and maxillofacial structures were performed using the standard protocol without intravenous contrast. Multiplanar CT image reconstructions of the maxillofacial structures were also generated. COMPARISON:  09/17/2017. FINDINGS: CT HEAD FINDINGS Brain: No intracranial hemorrhage or CT  evidence of large acute infarct. No intracranial mass lesion noted on this unenhanced exam. Mild atrophy. Prominent perivascular space versus remote lacunar infarct right lenticular nucleus. Vascular:  Vascular calcifications Skull: No skull fracture Other: Negative CT MAXILLOFACIAL FINDINGS Osseous: No facial fracture. Orbits: No orbital floor fracture or primary orbital abnormality noted. Sinuses: Clear. Soft tissues: No worrisome soft tissue abnormality. Other: Cervical spondylotic changes C5-6 and C6-7. Visualized aspect of the cervical spine without fracture identified. IMPRESSION: 1. No skull fracture or intracranial hemorrhage. 2. No facial or orbital fracture. 3. Mild atrophy. Electronically Signed   By: Genia Del M.D.   On: 09/23/2017 14:34    Micro Results     No results found for this or any previous visit (from the past 240 hour(s)).     Today   Subjective:   Jillian Vaughn IS stable for discharge  Objective:   Blood pressure 127/75, pulse 76, temperature 97.9 F (36.6 C), temperature source Oral, resp. rate 14, height 5\' 5"  (1.651 m), weight 35.4 kg, SpO2 98 %.   Intake/Output Summary (Last 24 hours) at 10/08/2017 0823 Last data filed at 10/07/2017 2223 Gross per 24 hour  Intake 1087.45 ml  Output 850 ml  Net 237.45 ml    Exam Patient is awake, no focal deficit but severely malnourished.  Continues to have poor p.o. intake.  [place].AT,PERRAL Supple Neck,No JVD, No cervical lymphadenopathy appriciated.  Symmetrical Chest wall movement, Good air movement bilaterally, CTAB RRR,No Gallops,Rubs or new Murmurs, No Parasternal Heave +ve B.Sounds, Abd Soft, Non tender, No organomegaly appriciated, No rebound -guarding or rigidity. No Cyanosis, Clubbing or edema, No new Rash or bruise  Data Review   CBC w Diff:  Lab Results  Component Value Date   WBC 5.5 10/03/2017   HGB 12.5 10/03/2017   HCT 36.0 10/03/2017   PLT 221 10/03/2017   LYMPHOPCT 9 10/02/2017   MONOPCT 8 10/02/2017   EOSPCT 0 10/02/2017   BASOPCT 0 10/02/2017    CMP:  Lab Results  Component Value Date   NA 143 10/03/2017   K 3.6 10/03/2017   CL 108 10/03/2017   CO2 27 10/03/2017   BUN 19  10/03/2017   CREATININE 0.85 10/03/2017   PROT 7.0 10/02/2017   ALBUMIN 4.3 10/02/2017   BILITOT 1.1 10/02/2017   ALKPHOS 62 10/02/2017   AST 31 10/02/2017   ALT <5 10/02/2017  .   Total Time in preparing paper work, data evaluation and todays exam - 66 minutes  Epifanio Lesches M.D on 10/08/2017 at 8:23 AM    Note: This dictation was prepared with Dragon dictation along with smaller phrase technology. Any transcriptional errors that result from this process are unintentional.

## 2017-10-09 NOTE — Progress Notes (Signed)
Clinical Education officer, museum (CSW) left a Advertising account executive for [employer] APS worker making him aware that patient discharged home with [facility] on 10/08/17.   [employer], LCSW [phone]

## 2017-11-17 ENCOUNTER — Ambulatory Visit (INDEPENDENT_AMBULATORY_CARE_PROVIDER_SITE_OTHER): Payer: Self-pay | Admitting: Orthopaedic Surgery

## 2018-01-11 ENCOUNTER — Other Ambulatory Visit: Payer: Self-pay

## 2018-01-11 ENCOUNTER — Emergency Department
Admission: EM | Admit: 2018-01-11 | Discharge: 2018-01-11 | Disposition: A | Discharge status: Home / Self Care | Payer: Medicare Other | Attending: Emergency Medicine | Admitting: Emergency Medicine

## 2018-01-11 ENCOUNTER — Emergency Department: Payer: Medicare Other

## 2018-01-11 DIAGNOSIS — Y998 Other external cause status: Secondary | ICD-10-CM | POA: Diagnosis not present

## 2018-01-11 DIAGNOSIS — Z7902 Long term (current) use of antithrombotics/antiplatelets: Secondary | ICD-10-CM | POA: Diagnosis not present

## 2018-01-11 DIAGNOSIS — Y92129 Unspecified place in nursing home as the place of occurrence of the external cause: Secondary | ICD-10-CM | POA: Insufficient documentation

## 2018-01-11 DIAGNOSIS — S0990XA Unspecified injury of head, initial encounter: Secondary | ICD-10-CM | POA: Diagnosis present

## 2018-01-11 DIAGNOSIS — Y9389 Activity, other specified: Secondary | ICD-10-CM | POA: Diagnosis not present

## 2018-01-11 DIAGNOSIS — G20 Parkinson's disease: Secondary | ICD-10-CM | POA: Diagnosis not present

## 2018-01-11 DIAGNOSIS — Z79899 Other long term (current) drug therapy: Secondary | ICD-10-CM | POA: Diagnosis not present

## 2018-01-11 DIAGNOSIS — W19XXXA Unspecified fall, initial encounter: Secondary | ICD-10-CM | POA: Diagnosis not present

## 2018-01-11 DIAGNOSIS — Z7982 Long term (current) use of aspirin: Secondary | ICD-10-CM | POA: Diagnosis not present

## 2018-01-11 DIAGNOSIS — S0083XA Contusion of other part of head, initial encounter: Secondary | ICD-10-CM | POA: Diagnosis not present

## 2018-01-11 LAB — CBC WITH DIFFERENTIAL/PLATELET
Abs Immature Granulocytes: 0.02 10*3/uL (ref 0.00–0.07)
Basophils Absolute: 0 10*3/uL (ref 0.0–0.1)
Basophils Relative: 0 %
EOS PCT: 1 %
Eosinophils Absolute: 0.1 10*3/uL (ref 0.0–0.5)
HEMATOCRIT: 36.1 % (ref 36.0–46.0)
HEMOGLOBIN: 11.9 g/dL — AB (ref 12.0–15.0)
Immature Granulocytes: 0 %
LYMPHS PCT: 23 %
Lymphs Abs: 1.3 10*3/uL (ref 0.7–4.0)
MCH: 32.2 pg (ref 26.0–34.0)
MCHC: 33 g/dL (ref 30.0–36.0)
MCV: 97.6 fL (ref 80.0–100.0)
Monocytes Absolute: 0.5 10*3/uL (ref 0.1–1.0)
Monocytes Relative: 9 %
NRBC: 0 % (ref 0.0–0.2)
Neutro Abs: 3.9 10*3/uL (ref 1.7–7.7)
Neutrophils Relative %: 67 %
Platelets: 248 10*3/uL (ref 150–400)
RBC: 3.7 MIL/uL — ABNORMAL LOW (ref 3.87–5.11)
RDW: 13 % (ref 11.5–15.5)
WBC: 5.8 10*3/uL (ref 4.0–10.5)

## 2018-01-11 LAB — URINALYSIS, COMPLETE (UACMP) WITH MICROSCOPIC
BACTERIA UA: NONE SEEN
BILIRUBIN URINE: NEGATIVE
Glucose, UA: NEGATIVE mg/dL
KETONES UR: NEGATIVE mg/dL
LEUKOCYTES UA: NEGATIVE
Nitrite: NEGATIVE
Protein, ur: NEGATIVE mg/dL
SPECIFIC GRAVITY, URINE: 1.012 (ref 1.005–1.030)
SQUAMOUS EPITHELIAL / LPF: NONE SEEN (ref 0–5)
pH: 7 (ref 5.0–8.0)

## 2018-01-11 LAB — TROPONIN I: Troponin I: <0.03 ng/mL (ref <0.03)

## 2018-01-11 LAB — COMPREHENSIVE METABOLIC PANEL
ANION GAP: 7 (ref 5–15)
AST: 22 U/L (ref 15–41)
Albumin: 4 g/dL (ref 3.5–5.0)
Alkaline Phosphatase: 61 U/L (ref 38–126)
BUN: 21 mg/dL (ref 8–23)
CHLORIDE: 106 mmol/L (ref 98–111)
CO2: 28 mmol/L (ref 22–32)
CREATININE: 0.87 mg/dL (ref 0.44–1.00)
Calcium: 8.9 mg/dL (ref 8.9–10.3)
Glucose, Bld: 93 mg/dL (ref 70–99)
Potassium: 3.8 mmol/L (ref 3.5–5.1)
Sodium: 141 mmol/L (ref 135–145)
Total Bilirubin: 0.7 mg/dL (ref 0.3–1.2)
Total Protein: 6.4 g/dL — ABNORMAL LOW (ref 6.5–8.1)

## 2018-01-11 NOTE — ED Notes (Signed)
Patient returned from CT via stretcher.

## 2018-01-11 NOTE — Discharge Instructions (Signed)
Fortunately today your blood work, your urinalysis, and your CT scan were reassuring.  Please follow-up with your primary care physician as needed and return to the emergency department for any concerns.  It was a pleasure to take care of you today, and thank you for coming to our emergency department.  If you have any questions or concerns before leaving please ask the nurse to grab me and I'm more than happy to go through your aftercare instructions again.  If you have any concerns once you are home that you are not improving or are in fact getting worse before you can make it to your follow-up appointment, please do not hesitate to call 911 and come back for further evaluation.  Darel Hong, MD  Results for orders placed or performed during the hospital encounter of 01/11/18  Comprehensive metabolic panel  Result Value Ref Range   Sodium 141 135 - 145 mmol/L   Potassium 3.8 3.5 - 5.1 mmol/L   Chloride 106 98 - 111 mmol/L   CO2 28 22 - 32 mmol/L   Glucose, Bld 93 70 - 99 mg/dL   BUN 21 8 - 23 mg/dL   Creatinine, Ser 0.87 0.44 - 1.00 mg/dL   Calcium 8.9 8.9 - 10.3 mg/dL   Total Protein 6.4 (L) 6.5 - 8.1 g/dL   Albumin 4.0 3.5 - 5.0 g/dL   AST 22 15 - 41 U/L   ALT <5 0 - 44 U/L   Alkaline Phosphatase 61 38 - 126 U/L   Total Bilirubin 0.7 0.3 - 1.2 mg/dL   GFR calc non Af Amer >60 >60 mL/min   GFR calc Af Amer >60 >60 mL/min   Anion gap 7 5 - 15  CBC with Differential  Result Value Ref Range   WBC 5.8 4.0 - 10.5 K/uL   RBC 3.70 (L) 3.87 - 5.11 MIL/uL   Hemoglobin 11.9 (L) 12.0 - 15.0 g/dL   HCT 36.1 36.0 - 46.0 %   MCV 97.6 80.0 - 100.0 fL   MCH 32.2 26.0 - 34.0 pg   MCHC 33.0 30.0 - 36.0 g/dL   RDW 13.0 11.5 - 15.5 %   Platelets 248 150 - 400 K/uL   nRBC 0.0 0.0 - 0.2 %   Neutrophils Relative % 67 %   Neutro Abs 3.9 1.7 - 7.7 K/uL   Lymphocytes Relative 23 %   Lymphs Abs 1.3 0.7 - 4.0 K/uL   Monocytes Relative 9 %   Monocytes Absolute 0.5 0.1 - 1.0 K/uL   Eosinophils  Relative 1 %   Eosinophils Absolute 0.1 0.0 - 0.5 K/uL   Basophils Relative 0 %   Basophils Absolute 0.0 0.0 - 0.1 K/uL   Immature Granulocytes 0 %   Abs Immature Granulocytes 0.02 0.00 - 0.07 K/uL  Troponin I - Once  Result Value Ref Range   Troponin I <0.03 <0.03 ng/mL  Urinalysis, Complete w Microscopic  Result Value Ref Range   Color, Urine STRAW (A) YELLOW   APPearance CLEAR (A) CLEAR   Specific Gravity, Urine 1.012 1.005 - 1.030   pH 7.0 5.0 - 8.0   Glucose, UA NEGATIVE NEGATIVE mg/dL   Hgb urine dipstick SMALL (A) NEGATIVE   Bilirubin Urine NEGATIVE NEGATIVE   Ketones, ur NEGATIVE NEGATIVE mg/dL   Protein, ur NEGATIVE NEGATIVE mg/dL   Nitrite NEGATIVE NEGATIVE   Leukocytes, UA NEGATIVE NEGATIVE   RBC / HPF 0-5 0 - 5 RBC/hpf   WBC, UA 0-5 0 - 5 WBC/hpf  Bacteria, UA NONE SEEN NONE SEEN   Squamous Epithelial / LPF NONE SEEN 0 - 5   Mucus PRESENT    Ct Head Wo Contrast  Result Date: 01/11/2018 CLINICAL DATA:  Fall EXAM: CT HEAD WITHOUT CONTRAST TECHNIQUE: Contiguous axial images were obtained from the base of the skull through the vertex without intravenous contrast. COMPARISON:  Head CT 10/04/2017 FINDINGS: Brain: There is no mass, hemorrhage or extra-axial collection. Prominent perivascular space of the right lentiform nucleus. The brain parenchyma is normal, without evidence of acute or chronic infarction. Vascular: No abnormal hyperdensity of the major intracranial arteries or dural venous sinuses. No intracranial atherosclerosis. Skull: The visualized skull base, calvarium and extracranial soft tissues are normal. Sinuses/Orbits: No fluid levels or advanced mucosal thickening of the visualized paranasal sinuses. No mastoid or middle ear effusion. The orbits are normal. IMPRESSION: Normal head CT. Electronically Signed   By: Ulyses Jarred M.D.   On: 01/11/2018 04:12

## 2018-01-11 NOTE — ED Provider Notes (Signed)
Jillian Vaughn Note  ____________________________________________   None    (approximate)  I have reviewed the triage vital signs and the nursing notes.   HISTORY  Chief Complaint Fall  Level 5 exemption history limited by the patient's severe dementia  HPI [name] is a 72 y.o. female comes to the emergency department via EMS after an unwitnessed fall at her assisted living facility.  She has a past medical history of severe Parkinson's dementia.  The patient was found on the floor and her husband became concerned and requested that she come to the emergency department.  The patient herself has no complaints.  She was able to bear weight.  According to husband she is behaving normally.    Past Medical History:  Diagnosis Date  . Arthritis   . Dementia ([place])   . Parkinson disease [facility])     Patient Active Problem List   Diagnosis Date Noted  . Protein-calorie malnutrition, severe 10/04/2017  . Syncope and collapse 10/02/2017  . Parkinson's disease ([place]) 06/10/2012    Past Surgical History:  Procedure Laterality Date  . APPENDECTOMY    . ESOPHAGOGASTRODUODENOSCOPY (EGD) WITH PROPOFOL N/A 06/19/2015   Procedure: ESOPHAGOGASTRODUODENOSCOPY (EGD) WITH PROPOFOL;  Surgeon: Lollie Sails, MD;  Location[facility] ENDOSCOPY;  Service: Endoscopy;  Laterality: N/A;    Prior to Admission medications   Medication Sig Start Date End Date Taking? Authorizing Vaughn  acetaminophen (TYLENOL) 650 MG CR tablet Take 1,300 mg by mouth 2 (two) times daily as needed for pain.    Vaughn, Historical, MD  amantadine (SYMMETREL) 100 MG capsule Take 100 mg by mouth 2 (two) times daily. 03/23/15   Vaughn, Historical, MD  aspirin 325 MG tablet Take 325 mg by mouth daily.    Vaughn, Historical, MD  carbidopa-levodopa (SINEMET) 25-100 MG tablet Take 2 tablets by mouth 4 (four) times daily.  03/02/15   Vaughn, Historical, MD  feeding  supplement, ENSURE ENLIVE, (ENSURE ENLIVE) LIQD Take 237 mLs by mouth 2 (two) times daily between meals. 10/04/17   Epifanio Lesches, MD  lubiprostone (AMITIZA) 24 MCG capsule Take 24 mcg by mouth daily with breakfast.    Vaughn, Historical, MD  meloxicam (MOBIC) 7.5 MG tablet Take 7.5 mg by mouth daily.    Vaughn, Historical, MD  midodrine (PROAMATINE) 10 MG tablet Take 1 tablet (10 mg total) by mouth 3 (three) times daily with meals. 10/04/17   Epifanio Lesches, MD  mirtazapine (REMERON) 7.5 MG tablet Take 7.5 mg by mouth at bedtime.    Vaughn, Historical, MD  pravastatin (PRAVACHOL) 40 MG tablet Take 40 mg by mouth at bedtime.    Vaughn, Historical, MD  Riboflavin (VITAMIN B-2) 50 MG TABS Take 50 mg by mouth daily.    Vaughn, Historical, MD  rOPINIRole (REQUIP) 0.5 MG tablet Take 0.5 mg by mouth daily.    Vaughn, Historical, MD  Vitamin D, Ergocalciferol, (DRISDOL) 50000 units CAPS capsule Take 50,000 Units by mouth every Wednesday. 04/18/15   Vaughn, Historical, MD    Allergies Patient has no known allergies.  No family history on file.  Social History Social History   Tobacco Use  . Smoking status: Never Smoker  . Smokeless tobacco: Never Used  Substance Use Topics  . Alcohol use: Yes  . Drug use: No    Review of Systems Level 5 exemption history is limited by the patient's dementia  ____________________________________________   PHYSICAL EXAM:  VITAL SIGNS: ED Triage Vitals  Enc  Vitals Group     BP      Pulse      Resp      Temp      Temp src      SpO2      Weight      Height      Head Circumference      Peak Flow      Pain Score      Pain Loc      Pain Edu?      Excl. in [place]?     Constitutional: Appears severely demented.  Nontoxic.  Moves all 4 extremities Eyes: PERRL EOMI. midrange and brisk Head: Atraumatic. Nose: No congestion/rhinnorhea. Mouth/Throat: No trismus Neck: No stridor.  No midline tenderness or  step-offs Cardiovascular: Normal rate, regular rhythm. Grossly normal heart sounds.  Good peripheral circulation. Respiratory: Normal respiratory effort.  No retractions. Lungs CTAB and moving good air Gastrointestinal: Soft nontender Musculoskeletal: No lower extremity edema   X equal length.  No discomfort with logroll.  No internal or external rotation Neurologic: Localizes pain all 4 extremities Skin:  Skin is warm, dry and intact. No rash noted. Psychiatric: Found dementia   ____________________________________________   DIFFERENTIAL includes but not limited to  Syncope, mechanical fall, hyponatremia, urinary tract infection, anemia, intracerebral hemorrhage ____________________________________________   LABS (all labs ordered are listed, but only abnormal results are displayed)  Labs Reviewed  COMPREHENSIVE METABOLIC PANEL - Abnormal; Notable for the following components:      Result Value   Total Protein 6.4 (*)    All other components within normal limits  CBC WITH DIFFERENTIAL/PLATELET - Abnormal; Notable for the following components:   RBC 3.70 (*)    Hemoglobin 11.9 (*)    All other components within normal limits  URINALYSIS, COMPLETE (UACMP) WITH MICROSCOPIC - Abnormal; Notable for the following components:   Color, Urine STRAW (*)    APPearance CLEAR (*)    Hgb urine dipstick SMALL (*)    All other components within normal limits  TROPONIN I    Lab work reviewed by me with no acute disease noted __________________________________________  EKG  ED ECG REPORT I, Darel Hong, the attending physician, personally viewed and interpreted this ECG.  Date: 01/12/2018 EKG Time:  Rate: 76 Rhythm: normal sinus rhythm QRS Axis: normal Intervals: normal ST/T Wave abnormalities: normal Narrative Interpretation: no evidence of acute ischemia  ____________________________________________  RADIOLOGY  Head CT reviewed by me with no acute  disease ____________________________________________   PROCEDURES  Procedure(s) performed: no  Procedures  Critical Care performed: no  ____________________________________________   INITIAL IMPRESSION / ASSESSMENT AND PLAN / ED COURSE  Pertinent labs & imaging results that were available during my care of the patient were reviewed by me and considered in my medical decision making (see chart for details).   As part of my medical decision making, I reviewed the following data within the [place] History obtained from family if available, nursing notes, old chart and ekg, as well as notes from prior ED visits.  Patient comes to the emergency department after an unwitnessed fall.  She has profound dementia and unable to provide any history.  She was able to bear weight.  Diagnostic uncertainty given the unclear history but includes metabolic versus anemic versus traumatic.  Kept on monitor for an hour with no ectopy.  Lab work reassuring and head CT negative.  Husband is comfortable bringing her home.  Strict return precautions been given.  ____________________________________________   FINAL CLINICAL IMPRESSION(S) / ED DIAGNOSES  Final diagnoses:  Fall, initial encounter  Contusion of face, initial encounter      NEW MEDICATIONS STARTED DURING THIS VISIT:  Discharge Medication List as of 01/11/2018  5:22 AM       Note:  This document was prepared using Dragon voice recognition software and may include unintentional dictation errors.     Darel Hong, MD 01/12/18 [phone]

## 2018-01-11 NOTE — ED Triage Notes (Signed)
Patient to ED via [facility] for an unwitnessed fall. Patient has parkinson's dementia. Patient without obvious injuries. Patient asked multiple times on scene by her husband if she wanted to come to the hospital and she finally stated yes.

## 2018-02-12 ENCOUNTER — Inpatient Hospital Stay
Admission: EM | Admit: 2018-02-12 | Discharge: 2018-02-19 | DRG: 871 | Disposition: A | Discharge status: Hospice | Payer: Medicare HMO | Attending: Internal Medicine | Admitting: Internal Medicine

## 2018-02-12 ENCOUNTER — Other Ambulatory Visit: Payer: Self-pay

## 2018-02-12 ENCOUNTER — Emergency Department: Payer: Medicare HMO

## 2018-02-12 DIAGNOSIS — Z681 Body mass index (BMI) 19 or less, adult: Secondary | ICD-10-CM | POA: Diagnosis not present

## 2018-02-12 DIAGNOSIS — Z23 Encounter for immunization: Secondary | ICD-10-CM | POA: Diagnosis not present

## 2018-02-12 DIAGNOSIS — Z7982 Long term (current) use of aspirin: Secondary | ICD-10-CM

## 2018-02-12 DIAGNOSIS — G9341 Metabolic encephalopathy: Secondary | ICD-10-CM | POA: Diagnosis not present

## 2018-02-12 DIAGNOSIS — J189 Pneumonia, unspecified organism: Secondary | ICD-10-CM | POA: Diagnosis present

## 2018-02-12 DIAGNOSIS — E860 Dehydration: Secondary | ICD-10-CM | POA: Diagnosis present

## 2018-02-12 DIAGNOSIS — N179 Acute kidney failure, unspecified: Secondary | ICD-10-CM | POA: Diagnosis present

## 2018-02-12 DIAGNOSIS — F419 Anxiety disorder, unspecified: Secondary | ICD-10-CM | POA: Diagnosis present

## 2018-02-12 DIAGNOSIS — F0280 Dementia in other diseases classified elsewhere without behavioral disturbance: Secondary | ICD-10-CM | POA: Diagnosis present

## 2018-02-12 DIAGNOSIS — E870 Hyperosmolality and hypernatremia: Secondary | ICD-10-CM | POA: Diagnosis present

## 2018-02-12 DIAGNOSIS — A419 Sepsis, unspecified organism: Secondary | ICD-10-CM | POA: Diagnosis present

## 2018-02-12 DIAGNOSIS — Z66 Do not resuscitate: Secondary | ICD-10-CM | POA: Diagnosis present

## 2018-02-12 DIAGNOSIS — Z79899 Other long term (current) drug therapy: Secondary | ICD-10-CM | POA: Diagnosis not present

## 2018-02-12 DIAGNOSIS — G20 Parkinson's disease: Secondary | ICD-10-CM | POA: Diagnosis present

## 2018-02-12 DIAGNOSIS — E43 Unspecified severe protein-calorie malnutrition: Secondary | ICD-10-CM | POA: Diagnosis present

## 2018-02-12 DIAGNOSIS — R6520 Severe sepsis without septic shock: Secondary | ICD-10-CM

## 2018-02-12 DIAGNOSIS — J9601 Acute respiratory failure with hypoxia: Secondary | ICD-10-CM | POA: Diagnosis present

## 2018-02-12 DIAGNOSIS — E876 Hypokalemia: Secondary | ICD-10-CM | POA: Diagnosis present

## 2018-02-12 DIAGNOSIS — Z515 Encounter for palliative care: Secondary | ICD-10-CM | POA: Diagnosis not present

## 2018-02-12 DIAGNOSIS — Z7189 Other specified counseling: Secondary | ICD-10-CM | POA: Diagnosis not present

## 2018-02-12 LAB — DIFFERENTIAL
ABS IMMATURE GRANULOCYTES: 0.21 10*3/uL — AB (ref 0.00–0.07)
Basophils Absolute: 0.1 10*3/uL (ref 0.0–0.1)
Basophils Relative: 0 %
Eosinophils Absolute: 0 10*3/uL (ref 0.0–0.5)
Eosinophils Relative: 0 %
Immature Granulocytes: 1 %
Lymphocytes Relative: 2 %
Lymphs Abs: 0.4 10*3/uL — ABNORMAL LOW (ref 0.7–4.0)
Monocytes Absolute: 1.4 10*3/uL — ABNORMAL HIGH (ref 0.1–1.0)
Monocytes Relative: 7 %
NEUTROS ABS: 18.4 10*3/uL — AB (ref 1.7–7.7)
Neutrophils Relative %: 90 %

## 2018-02-12 LAB — URINALYSIS, COMPLETE (UACMP) WITH MICROSCOPIC
Bilirubin Urine: NEGATIVE
Glucose, UA: NEGATIVE mg/dL
Ketones, ur: NEGATIVE mg/dL
Leukocytes, UA: NEGATIVE
Nitrite: NEGATIVE
Protein, ur: 30 mg/dL — AB
SPECIFIC GRAVITY, URINE: 1.019 (ref 1.005–1.030)
Squamous Epithelial / HPF: NONE SEEN (ref 0–5)
pH: 5 (ref 5.0–8.0)

## 2018-02-12 LAB — COMPREHENSIVE METABOLIC PANEL
ALT: 21 U/L (ref 0–44)
ANION GAP: 11 (ref 5–15)
AST: 19 U/L (ref 15–41)
Albumin: 3 g/dL — ABNORMAL LOW (ref 3.5–5.0)
Alkaline Phosphatase: 78 U/L (ref 38–126)
BUN: 35 mg/dL — ABNORMAL HIGH (ref 8–23)
CHLORIDE: 114 mmol/L — AB (ref 98–111)
CO2: 24 mmol/L (ref 22–32)
CREATININE: 1.29 mg/dL — AB (ref 0.44–1.00)
Calcium: 9.1 mg/dL (ref 8.9–10.3)
GFR calc non Af Amer: 41 mL/min — ABNORMAL LOW (ref >60)
GFR, EST AFRICAN AMERICAN: 47 mL/min — AB (ref >60)
Glucose, Bld: 155 mg/dL — ABNORMAL HIGH (ref 70–99)
Potassium: 3 mmol/L — ABNORMAL LOW (ref 3.5–5.1)
SODIUM: 149 mmol/L — AB (ref 135–145)
Total Bilirubin: 1 mg/dL (ref 0.3–1.2)
Total Protein: 7.1 g/dL (ref 6.5–8.1)

## 2018-02-12 LAB — CBC
HCT: 36.6 % (ref 36.0–46.0)
Hemoglobin: 12.2 g/dL (ref 12.0–15.0)
MCH: 31.9 pg (ref 26.0–34.0)
MCHC: 33.3 g/dL (ref 30.0–36.0)
MCV: 95.6 fL (ref 80.0–100.0)
PLATELETS: 403 10*3/uL — AB (ref 150–400)
RBC: 3.83 MIL/uL — ABNORMAL LOW (ref 3.87–5.11)
RDW: 12.3 % (ref 11.5–15.5)
WBC: 20.6 10*3/uL — ABNORMAL HIGH (ref 4.0–10.5)
nRBC: 0 % (ref 0.0–0.2)

## 2018-02-12 LAB — PROTIME-INR
INR: 1.18
Prothrombin Time: 14.9 seconds (ref 11.4–15.2)

## 2018-02-12 LAB — TROPONIN I: Troponin I: <0.03 ng/mL (ref <0.03)

## 2018-02-12 LAB — APTT: aPTT: 26 seconds (ref 24–36)

## 2018-02-12 LAB — INFLUENZA PANEL BY PCR (TYPE A & B)
Influenza A By PCR: NEGATIVE
Influenza B By PCR: NEGATIVE

## 2018-02-12 LAB — CG4 I-STAT (LACTIC ACID): Lactic Acid, Venous: 0.96 mmol/L (ref 0.5–1.9)

## 2018-02-12 LAB — PROCALCITONIN: Procalcitonin: 2.14 ng/mL

## 2018-02-12 MED ORDER — SODIUM CHLORIDE 0.9 % IV SOLN
1.0000 g | INTRAVENOUS | Status: DC
  Administered 2018-02-12 – 2018-02-15 (×4): 1 g via INTRAVENOUS
  Filled 2018-02-12 (×4): qty 1
  Filled 2018-02-12: qty 10

## 2018-02-12 MED ORDER — ACETAMINOPHEN 325 MG PO TABS
650.0000 mg | ORAL_TABLET | Freq: Four times a day (QID) | ORAL | Status: DC | PRN
  Administered 2018-02-14: 650 mg via ORAL
  Filled 2018-02-12: qty 2

## 2018-02-12 MED ORDER — SODIUM CHLORIDE 0.9 % IV BOLUS
1000.0000 mL | Freq: Once | INTRAVENOUS | Status: AC
  Administered 2018-02-12: 1000 mL via INTRAVENOUS

## 2018-02-12 MED ORDER — VANCOMYCIN HCL IN DEXTROSE 1-5 GM/200ML-% IV SOLN
1000.0000 mg | Freq: Once | INTRAVENOUS | Status: DC
  Filled 2018-02-12: qty 200

## 2018-02-12 MED ORDER — ONDANSETRON HCL 4 MG PO TABS: 4.0000 mg | ORAL_TABLET | Freq: Four times a day (QID) | ORAL | Status: DC | PRN

## 2018-02-12 MED ORDER — POTASSIUM CHLORIDE IN NACL 20-0.45 MEQ/L-% IV SOLN
INTRAVENOUS | Status: DC
  Administered 2018-02-12 – 2018-02-13 (×2): via INTRAVENOUS
  Filled 2018-02-12 (×2): qty 1000

## 2018-02-12 MED ORDER — ALBUTEROL SULFATE (2.5 MG/3ML) 0.083% IN NEBU
2.5000 mg | INHALATION_SOLUTION | RESPIRATORY_TRACT | Status: DC | PRN
  Administered 2018-02-14 – 2018-02-16 (×3): 2.5 mg via RESPIRATORY_TRACT
  Filled 2018-02-12 (×4): qty 3

## 2018-02-12 MED ORDER — ONDANSETRON HCL 4 MG/2ML IJ SOLN: 4.0000 mg | Freq: Four times a day (QID) | INTRAMUSCULAR | Status: DC | PRN

## 2018-02-12 MED ORDER — ENOXAPARIN SODIUM 40 MG/0.4ML ~~LOC~~ SOLN
30.0000 mg | SUBCUTANEOUS | Status: DC
  Administered 2018-02-12 – 2018-02-15 (×4): 30 mg via SUBCUTANEOUS
  Filled 2018-02-12 (×4): qty 0.4

## 2018-02-12 MED ORDER — ACETAMINOPHEN 650 MG RE SUPP: 650.0000 mg | Freq: Four times a day (QID) | RECTAL | Status: DC | PRN

## 2018-02-12 MED ORDER — SODIUM CHLORIDE 0.9 % IV SOLN
500.0000 mg | INTRAVENOUS | Status: DC
  Administered 2018-02-12 – 2018-02-13 (×2): 500 mg via INTRAVENOUS
  Filled 2018-02-12 (×3): qty 500

## 2018-02-12 MED ORDER — ALBUTEROL SULFATE (2.5 MG/3ML) 0.083% IN NEBU
5.0000 mg | INHALATION_SOLUTION | Freq: Once | RESPIRATORY_TRACT | Status: AC
  Administered 2018-02-12: 5 mg via RESPIRATORY_TRACT
  Filled 2018-02-12: qty 6

## 2018-02-12 MED ORDER — POLYETHYLENE GLYCOL 3350 17 G PO PACK: 17.0000 g | PACK | Freq: Every day | ORAL | Status: DC | PRN

## 2018-02-12 MED ORDER — PNEUMOCOCCAL VAC POLYVALENT 25 MCG/0.5ML IJ INJ
0.5000 mL | INJECTION | INTRAMUSCULAR | Status: AC
  Administered 2018-02-13: 0.5 mL via INTRAMUSCULAR
  Filled 2018-02-12: qty 0.5

## 2018-02-12 MED ORDER — INFLUENZA VAC SPLIT HIGH-DOSE 0.5 ML IM SUSY
0.5000 mL | PREFILLED_SYRINGE | INTRAMUSCULAR | Status: DC
  Filled 2018-02-12 (×2): qty 0.5

## 2018-02-12 MED ORDER — SODIUM CHLORIDE 0.9 % IV SOLN
2.0000 g | Freq: Once | INTRAVENOUS | Status: AC
  Administered 2018-02-12: 2 g via INTRAVENOUS
  Filled 2018-02-12: qty 2

## 2018-02-12 NOTE — ED Notes (Signed)
Patient transported to X-ray 

## 2018-02-12 NOTE — Consult Note (Signed)
CODE SEPSIS - PHARMACY COMMUNICATION  **Broad Spectrum Antibiotics should be administered within 1 hour of Sepsis diagnosis**  Time Code Sepsis Called/Page Received: 1511  Antibiotics Ordered: vancomycin and cefepime  Time of 1st antibiotic administration: 1550  Additional action taken by pharmacy: none required  If necessary, Name of Provider/Nurse Contacted: N/A    Dallie Piles ,PharmD Clinical Pharmacist  02/12/2018  3:53 PM

## 2018-02-12 NOTE — ED Provider Notes (Addendum)
[facility] Emergency Department Provider Note  ____________________________________________  Time seen: Approximately 3:43 PM  I have reviewed the triage vital signs and the nursing notes.   HISTORY  Chief Complaint Cough and Fever  Level 5 Caveat: Portions of the History and Physical including HPI and review of systems are unable to be completely obtained due to patient being a poor historian  History provided by sister at bedside   HPI Jillian Vaughn is a 72 y.o. female with a history of Parkinson's disease who is come to the ED today due to malaise, generalized weakness, decreased oral intake and energy level over the past 2 to 3 days.  Patient is noncommunicative.  Sister at bedside reports the patient had a fever of 101 this morning for which they gave her Tylenol recently.  No vomiting or diarrhea.  No known pain syndrome.      Past Medical History:  Diagnosis Date  . Arthritis   . Dementia ([place])   . Parkinson disease [facility])      Patient Active Problem List   Diagnosis Date Noted  . Protein-calorie malnutrition, severe 10/04/2017  . Syncope and collapse 10/02/2017  . Parkinson's disease ([place]) 06/10/2012     Past Surgical History:  Procedure Laterality Date  . APPENDECTOMY    . ESOPHAGOGASTRODUODENOSCOPY (EGD) WITH PROPOFOL N/A 06/19/2015   Procedure: ESOPHAGOGASTRODUODENOSCOPY (EGD) WITH PROPOFOL;  Surgeon: Lollie Sails, MD;  Location: [facility] ENDOSCOPY;  Service: Endoscopy;  Laterality: N/A;     Prior to Admission medications   Medication Sig Start Date End Date Taking? Authorizing Provider  acetaminophen (TYLENOL) 650 MG CR tablet Take 1,300 mg by mouth 2 (two) times daily as needed for pain.    [provider]  amantadine (SYMMETREL) 100 MG capsule Take 100 mg by mouth 2 (two) times daily. 03/23/15   [provider]  aspirin 325 MG tablet Take 325 mg by mouth daily.    [provider]  carbidopa-levodopa  (SINEMET) 25-100 MG tablet Take 2 tablets by mouth 4 (four) times daily.  03/02/15   [provider]  feeding supplement, ENSURE ENLIVE, (ENSURE ENLIVE) LIQD Take 237 mLs by mouth 2 (two) times daily between meals. 10/04/17   Epifanio Lesches, MD  lubiprostone (AMITIZA) 24 MCG capsule Take 24 mcg by mouth daily with breakfast.    [provider]  meloxicam (MOBIC) 7.5 MG tablet Take 7.5 mg by mouth daily.    [provider]  midodrine (PROAMATINE) 10 MG tablet Take 1 tablet (10 mg total) by mouth 3 (three) times daily with meals. 10/04/17   Epifanio Lesches, MD  mirtazapine (REMERON) 7.5 MG tablet Take 7.5 mg by mouth at bedtime.    [provider]  pravastatin (PRAVACHOL) 40 MG tablet Take 40 mg by mouth at bedtime.    [provider]  Riboflavin (VITAMIN B-2) 50 MG TABS Take 50 mg by mouth daily.    [provider]  rOPINIRole (REQUIP) 0.5 MG tablet Take 0.5 mg by mouth daily.    [provider]  Vitamin D, Ergocalciferol, (DRISDOL) 50000 units CAPS capsule Take 50,000 Units by mouth every Wednesday. 04/18/15   [provider]     Allergies Patient has no known allergies.   No family history on file.  Social History Social History   Tobacco Use  . Smoking status: Never Smoker  . Smokeless tobacco: Never Used  Substance Use Topics  . Alcohol use: Yes  . Drug use: No  Review of Systems  Constitutional: Positive fever ENT:   Positive nasal congestion Cardiovascular:   No chest pain or syncope. Respiratory:   Positive shortness of breath Gastrointestinal:   Negative for vomiting and diarrhea.  Musculoskeletal:   Negative for focal pain or swelling All other systems reviewed and are negative except as documented above in ROS and HPI.  ____________________________________________   PHYSICAL EXAM:  VITAL SIGNS: ED Triage Vitals  Enc Vitals Group     BP 02/12/18 1435 (!) 117/58     Pulse Rate  02/12/18 1435 93     Resp 02/12/18 1435 (!) 24     Temp 02/12/18 1435 98.2 F (36.8 C)     Temp Source 02/12/18 1435 Axillary     SpO2 02/12/18 1435 (!) 88 %     Weight 02/12/18 1436 80 lb (36.3 kg)     Height --      Head Circumference --      Peak Flow --      Pain Score --      Pain Loc --      Pain Edu? --      Excl. in [place]? --     Vital signs reviewed, nursing assessments reviewed.   Constitutional:   Awake and alert.  Not oriented.  Ill-appearing.  Cachectic. Eyes:   Conjunctivae are normal. EOMI. PERRL. ENT      Head:   Normocephalic and atraumatic.      Nose:   No congestion/rhinnorhea.       Mouth/Throat:   Dry mucous membranes, no pharyngeal erythema. No peritonsillar mass.       Neck:   No meningismus. Full ROM. Hematological/Lymphatic/Immunilogical:   No cervical lymphadenopathy. Cardiovascular:   Tachycardia heart rate 100. Symmetric bilateral radial and DP pulses.  No murmurs. Cap refill less than 2 seconds. Respiratory:   Increased work of breathing with supraclavicular retractions.  Diminished air movement in all lung fields.  No wheezing.   Gastrointestinal:   Soft and nontender. Non distended. There is no CVA tenderness.  No rebound, rigidity, or guarding. Musculoskeletal:   Normal range of motion in all extremities. No joint effusions.  No lower extremity tenderness.  No edema. Neurologic:   Nonverbal Motor grossly intact. No acute focal neurologic deficits are appreciated.  Skin:    Skin is warm, dry and intact. No rash noted.  No petechiae, purpura, or bullae.  ____________________________________________    LABS (pertinent positives/negatives) (all labs ordered are listed, but only abnormal results are displayed) Labs Reviewed  CBC - Abnormal; Notable for the following components:      Result Value   WBC 20.6 (*)    RBC 3.83 (*)    Platelets 403 (*)    All other components within normal limits  COMPREHENSIVE METABOLIC PANEL - Abnormal; Notable for  the following components:   Sodium 149 (*)    Potassium 3.0 (*)    Chloride 114 (*)    Glucose, Bld 155 (*)    BUN 35 (*)    Creatinine, Ser 1.29 (*)    Albumin 3.0 (*)    GFR calc non Af Amer 41 (*)    GFR calc Af Amer 47 (*)    All other components within normal limits  CULTURE, BLOOD (ROUTINE X 2)  CULTURE, BLOOD (ROUTINE X 2)  URINE CULTURE  TROPONIN I  PROCALCITONIN  APTT  PROTIME-INR  URINALYSIS, COMPLETE (UACMP) WITH MICROSCOPIC  INFLUENZA PANEL BY PCR (TYPE A & B)  CBC WITH  DIFFERENTIAL/PLATELET  I-STAT CG4 LACTIC ACID, ED  I-STAT CG4 LACTIC ACID, ED  CG4 I-STAT (LACTIC ACID)   ____________________________________________   EKG    ____________________________________________    RADIOLOGY  Dg Chest 2 View  Result Date: 02/12/2018 CLINICAL DATA:  Cough and shortness of breath EXAM: CHEST - 2 VIEW COMPARISON:  October 04, 2017 FINDINGS: Left perihilar infiltrate. Infiltrate in medial right lung base. The heart, hila, mediastinum, lungs, and pleura are otherwise unremarkable. IMPRESSION: Bilateral infiltrates as above, worrisome for pneumonia given history. Recommend follow-up chest x-ray to ensure resolution. Electronically Signed   By: Dorise Bullion III M.D   On: 02/12/2018 15:22    ____________________________________________   PROCEDURES .Critical Care Performed by: Carrie Mew, MD Authorized by: Carrie Mew, MD   Critical care provider statement:    Critical care time (minutes):  30   Critical care time was exclusive of:  Separately billable procedures and treating other patients   Critical care was necessary to treat or prevent imminent or life-threatening deterioration of the following conditions:  Sepsis, respiratory failure and renal failure   Critical care was time spent personally by me on the following activities:  Development of treatment plan with patient or surrogate, discussions with consultants, evaluation of patient's response  to treatment, examination of patient, obtaining history from patient or surrogate, ordering and performing treatments and interventions, ordering and review of laboratory studies, ordering and review of radiographic studies, pulse oximetry, re-evaluation of patient's condition and review of old charts    ____________________________________________  DIFFERENTIAL DIAGNOSIS   Pneumonia, sepsis, possibly UTI or influenza.  CLINICAL IMPRESSION / ASSESSMENT AND PLAN / ED COURSE  Pertinent labs & imaging results that were available during my care of the patient were reviewed by me and considered in my medical decision making (see chart for details).      Clinical Course as of Feb 13 1635  Thu Feb 12, 2018  1520 Pt p/w increased WOB and diminished BS diffusely. Hypoxia, tachypnea. Had fever to 101 at home prior to taking tylenol which is currently masking the fever.  Very concerned for sepsis, due to bacterial pna vs influenza. Cefepime and vanco ordered. NS bolus. Will admit.   [PS]  72 Sister at bedside not aware of any advanced directives or CODE STATUS.  Review of the electronic medical record shows that in August 2019 CODE STATUS was changed to DNR.  I called her daughters to phone numbers to confirm as this person is been described to me as the power of attorney, there were no answers.  Left hip[employer] compliant voicemail to please call me back.   [PS]  5537 Discussed home situation and CODE STATUS with the patient's power of attorney, daughter Jillian Vaughn today.  She confirms DNR.  She also notes that she would be supportive of acute rehab if warranted after patient is improving.  She also notes the patient had been under the care of home hospice in the past which she found to be an  excellent service as well.   [PS]    Clinical Course User Index [PS] Carrie Mew, MD     ____________________________________________   FINAL CLINICAL IMPRESSION(S) / ED DIAGNOSES    Final diagnoses:   Pneumonia of both lungs due to infectious organism, unspecified part of lung  Sepsis with acute hypoxic respiratory failure, due to unspecified organism, unspecified whether septic shock present ([place])  AKI (acute kidney injury) ([place])  Protein calorie malnutrition   ED Discharge Orders    None  Portions of this note were generated with dragon dictation software. Dictation errors may occur despite best attempts at proofreading.   Carrie Mew, MD 02/12/18 [place]    Carrie Mew, MD 02/12/18 [phone]

## 2018-02-12 NOTE — H&P (Signed)
[place] [facility] [place] NAME: Jillian Vaughn    MR#:  [id number]  DATE OF BIRTH:  1945-04-14  DATE OF ADMISSION:  02/12/2018  PRIMARY CARE PHYSICIAN: Lenard Simmer, MD   REQUESTING/REFERRING PHYSICIAN: Dr. Joni Fears  CHIEF COMPLAINT:   Chief Complaint  Patient presents with  . Cough  . Fever    HISTORY OF PRESENT ILLNESS:  Jillian Vaughn  is a 72 y.o. female with a known history of dementia, arthritis, Parkinson's disease presents from home due to cough and not feeling well.  Patient has had decreased oral intake.  Here patient is talking only in 1-2 words and is poor historian with her dementia.  Family not available at bedside at this time. Patient was admitted last in August when she was discharged home with hospice services due to poor oral intake and dementia. Today patient has been found to have sepsis, bilateral pneumonia and acute hypoxic respiratory failure.  PAST MEDICAL HISTORY:   Past Medical History:  Diagnosis Date  . Arthritis   . Dementia ([place])   . Parkinson disease ([place])     PAST SURGICAL HISTORY:   Past Surgical History:  Procedure Laterality Date  . APPENDECTOMY    . ESOPHAGOGASTRODUODENOSCOPY (EGD) WITH PROPOFOL N/A 06/19/2015   Procedure: ESOPHAGOGASTRODUODENOSCOPY (EGD) WITH PROPOFOL;  Surgeon: Lollie Sails, MD;  Location: [facility] ENDOSCOPY;  Service: Endoscopy;  Laterality: N/A;    SOCIAL HISTORY:   Social History   Tobacco Use  . Smoking status: Never Smoker  . Smokeless tobacco: Never Used  Substance Use Topics  . Alcohol use: Yes    FAMILY HISTORY:  No family history on file.  DRUG ALLERGIES:  No Known Allergies  REVIEW OF SYSTEMS:   Review of Systems  Unable to perform ROS: Dementia    MEDICATIONS AT HOME:   Prior to Admission medications   Medication Sig Start Date End Date Taking? Authorizing Provider  acetaminophen (TYLENOL) 650 MG CR tablet Take 1,300 mg by mouth 2 (two) times  daily as needed for pain.    [provider]  amantadine (SYMMETREL) 100 MG capsule Take 100 mg by mouth 2 (two) times daily. 03/23/15   [provider]  aspirin 325 MG tablet Take 325 mg by mouth daily.    [provider]  carbidopa-levodopa (SINEMET) 25-100 MG tablet Take 2 tablets by mouth 4 (four) times daily.  03/02/15   [provider]  feeding supplement, ENSURE ENLIVE, (ENSURE ENLIVE) LIQD Take 237 mLs by mouth 2 (two) times daily between meals. 10/04/17   Epifanio Lesches, MD  lubiprostone (AMITIZA) 24 MCG capsule Take 24 mcg by mouth daily with breakfast.    [provider]  meloxicam (MOBIC) 7.5 MG tablet Take 7.5 mg by mouth daily.    [provider]  midodrine (PROAMATINE) 10 MG tablet Take 1 tablet (10 mg total) by mouth 3 (three) times daily with meals. 10/04/17   Epifanio Lesches, MD  mirtazapine (REMERON) 7.5 MG tablet Take 7.5 mg by mouth at bedtime.    [provider]  pravastatin (PRAVACHOL) 40 MG tablet Take 40 mg by mouth at bedtime.    [provider]  Riboflavin (VITAMIN B-2) 50 MG TABS Take 50 mg by mouth daily.    [provider]  rOPINIRole (REQUIP) 0.5 MG tablet Take 0.5 mg by mouth daily.    [provider]  Vitamin D, Ergocalciferol, (DRISDOL) 50000 units CAPS capsule Take 50,000 Units by mouth every  Wednesday. 04/18/15   [provider]     VITAL SIGNS:  Blood pressure (!) 117/58, pulse 93, temperature 98.2 F (36.8 C), temperature source Axillary, resp. rate (!) 24, weight 36.3 kg, SpO2 (!) 88 %.  PHYSICAL EXAMINATION:  Physical Exam  GENERAL:  72 y.o.-year-old patient lying in the bed.   EYES: Pupils equal, round, reactive to light and accommodation. No scleral icterus. Extraocular muscles intact.  HEENT: Head atraumatic, normocephalic. Oropharynx and nasopharynx clear. No oropharyngeal erythema, moist oral mucosa  NECK:  Supple, no jugular venous distention.  No thyroid enlargement, no tenderness.  LUNGS: Increased work of breathing.  Clear breath sounds bilaterally CARDIOVASCULAR: S1, S2 normal. No murmurs, rubs, or gallops.  ABDOMEN: Soft, nontender, nondistended. Bowel sounds present. No organomegaly or mass.  EXTREMITIES: No pedal edema, cyanosis, or clubbing. + 2 pedal & radial pulses b/l.   NEUROLOGIC: Moves all 4 extremities.  Following instructions.  Motor strength 4+ / 5 upper and lower extremities PSYCHIATRIC: The patient is drowzy SKIN: No obvious rash, lesion, or ulcer.   LABORATORY PANEL:   CBC Recent Labs  Lab 02/12/18 1456  WBC 20.6*  HGB 12.2  HCT 36.6  PLT 403*   ------------------------------------------------------------------------------------------------------------------  Chemistries  Recent Labs  Lab 02/12/18 1456  NA 149*  K 3.0*  CL 114*  CO2 24  GLUCOSE 155*  BUN 35*  CREATININE 1.29*  CALCIUM 9.1  AST 19  ALT 21  ALKPHOS 78  BILITOT 1.0   ------------------------------------------------------------------------------------------------------------------  Cardiac Enzymes No results for input(s): TROPONINI in the last 168 hours. ------------------------------------------------------------------------------------------------------------------  RADIOLOGY:  Dg Chest 2 View  Result Date: 02/12/2018 CLINICAL DATA:  Cough and shortness of breath EXAM: CHEST - 2 VIEW COMPARISON:  October 04, 2017 FINDINGS: Left perihilar infiltrate. Infiltrate in medial right lung base. The heart, hila, mediastinum, lungs, and pleura are otherwise unremarkable. IMPRESSION: Bilateral infiltrates as above, worrisome for pneumonia given history. Recommend follow-up chest x-ray to ensure resolution. Electronically Signed   By: Dorise Bullion III M.D   On: 02/12/2018 15:22     IMPRESSION AND PLAN:   *Bilateral community-acquired pneumonia with sepsis and acute hypoxic respiratory failure We will start IV fluid  resuscitation.  Check lactic acid.  Pneumonia order set used.  Culture sent and pending.  Start ceftriaxone and azithromycin.  Nebulizers as needed.  Oxygen to keep saturations over 90%.  *Hyponatremia secondary to dehydration with dementia.  Start half-normal saline.  *Hypokalemia.  Will replace through IV.  *Dementia.  Monitor for inpatient delirium.  *DVT prophylaxis with Lovenox  All the records are reviewed and case discussed with ED provider. Management plans discussed with the patient, family and they are in agreement.  CODE STATUS: DNR  TOTAL TIME TAKING CARE OF THIS PATIENT: 40 minutes.   Neita Carp M.D on 02/12/2018 at 4:08 PM  Between 7am to 6pm - Pager - [phone]  After 6pm go to [email or web address] - password [email or web address] [facility]  Office  [phone]  CC: Primary care physician; Lenard Simmer, MD  Note: This dictation was prepared with Dragon dictation along with smaller phrase technology. Any transcriptional errors that result from this process are unintentional.

## 2018-02-12 NOTE — ED Triage Notes (Signed)
Pt comes into the ED via EMS from home, pt sister is present and states she has had a cough with fever the past couple of days. Pt is unable to communicate due to severe parkinsons. Pt is alert, eye open. Air way is patent. Pt lives at home with her husband who cares for her.

## 2018-02-13 LAB — CBC
HEMATOCRIT: 34.5 % — AB (ref 36.0–46.0)
Hemoglobin: 11 g/dL — ABNORMAL LOW (ref 12.0–15.0)
MCH: 31.3 pg (ref 26.0–34.0)
MCHC: 31.9 g/dL (ref 30.0–36.0)
MCV: 98.3 fL (ref 80.0–100.0)
Platelets: 339 10*3/uL (ref 150–400)
RBC: 3.51 MIL/uL — AB (ref 3.87–5.11)
RDW: 12.4 % (ref 11.5–15.5)
WBC: 20.8 10*3/uL — ABNORMAL HIGH (ref 4.0–10.5)
nRBC: 0 % (ref 0.0–0.2)

## 2018-02-13 LAB — BASIC METABOLIC PANEL
ANION GAP: 8 (ref 5–15)
BUN: 29 mg/dL — ABNORMAL HIGH (ref 8–23)
CHLORIDE: 118 mmol/L — AB (ref 98–111)
CO2: 23 mmol/L (ref 22–32)
Calcium: 8.3 mg/dL — ABNORMAL LOW (ref 8.9–10.3)
Creatinine, Ser: 0.97 mg/dL (ref 0.44–1.00)
GFR calc Af Amer: >60 mL/min (ref >60)
GFR calc non Af Amer: 58 mL/min — ABNORMAL LOW (ref >60)
Glucose, Bld: 128 mg/dL — ABNORMAL HIGH (ref 70–99)
Potassium: 3.2 mmol/L — ABNORMAL LOW (ref 3.5–5.1)
Sodium: 149 mmol/L — ABNORMAL HIGH (ref 135–145)

## 2018-02-13 LAB — SODIUM: Sodium: 144 mmol/L (ref 135–145)

## 2018-02-13 MED ORDER — POTASSIUM CHLORIDE CRYS ER 20 MEQ PO TBCR: 20.0000 meq | EXTENDED_RELEASE_TABLET | Freq: Two times a day (BID) | ORAL | Status: DC

## 2018-02-13 MED ORDER — MIRTAZAPINE 15 MG PO TABS
7.5000 mg | ORAL_TABLET | Freq: Every day | ORAL | Status: DC
  Administered 2018-02-13 – 2018-02-15 (×3): 7.5 mg via ORAL
  Filled 2018-02-13 (×3): qty 1

## 2018-02-13 MED ORDER — POTASSIUM CHLORIDE 20 MEQ/15ML (10%) PO SOLN
20.0000 meq | Freq: Two times a day (BID) | ORAL | Status: AC
  Administered 2018-02-13 (×2): 20 meq via ORAL
  Filled 2018-02-13 (×2): qty 15

## 2018-02-13 MED ORDER — VITAMIN B-2 50 MG PO TABS: 50.0000 mg | ORAL_TABLET | Freq: Every day | ORAL | Status: DC

## 2018-02-13 MED ORDER — AMANTADINE HCL 100 MG PO CAPS
100.0000 mg | ORAL_CAPSULE | Freq: Two times a day (BID) | ORAL | Status: DC
  Administered 2018-02-13 – 2018-02-16 (×7): 100 mg via ORAL
  Filled 2018-02-13 (×8): qty 1

## 2018-02-13 MED ORDER — ROPINIROLE HCL 1 MG PO TABS
0.5000 mg | ORAL_TABLET | Freq: Every day | ORAL | Status: DC
  Administered 2018-02-13 – 2018-02-16 (×4): 0.5 mg via ORAL
  Filled 2018-02-13 (×4): qty 1

## 2018-02-13 MED ORDER — GUAIFENESIN-DM 100-10 MG/5ML PO SYRP
5.0000 mL | ORAL_SOLUTION | ORAL | Status: DC | PRN
  Administered 2018-02-14: 5 mL via ORAL
  Filled 2018-02-13: qty 5

## 2018-02-13 MED ORDER — ENSURE ENLIVE PO LIQD
237.0000 mL | Freq: Three times a day (TID) | ORAL | Status: DC
  Administered 2018-02-13 – 2018-02-15 (×5): 237 mL via ORAL

## 2018-02-13 MED ORDER — LUBIPROSTONE 24 MCG PO CAPS
24.0000 ug | ORAL_CAPSULE | Freq: Every day | ORAL | Status: DC
  Administered 2018-02-14 – 2018-02-16 (×3): 24 ug via ORAL
  Filled 2018-02-13 (×3): qty 1

## 2018-02-13 MED ORDER — ASPIRIN 325 MG PO TABS
325.0000 mg | ORAL_TABLET | Freq: Every day | ORAL | Status: DC
  Administered 2018-02-13 – 2018-02-16 (×4): 325 mg via ORAL
  Filled 2018-02-13 (×4): qty 1

## 2018-02-13 MED ORDER — ADULT MULTIVITAMIN W/MINERALS CH
1.0000 | ORAL_TABLET | Freq: Every day | ORAL | Status: DC
  Administered 2018-02-13 – 2018-02-15 (×2): 1 via ORAL
  Filled 2018-02-13 (×3): qty 1

## 2018-02-13 MED ORDER — DEXTROSE 5 % IV SOLN
INTRAVENOUS | Status: DC
  Administered 2018-02-13 – 2018-02-14 (×2): via INTRAVENOUS

## 2018-02-13 MED ORDER — PRAVASTATIN SODIUM 20 MG PO TABS
40.0000 mg | ORAL_TABLET | Freq: Every day | ORAL | Status: DC
  Administered 2018-02-13 – 2018-02-15 (×3): 40 mg via ORAL
  Filled 2018-02-13 (×3): qty 2

## 2018-02-13 MED ORDER — ACETAMINOPHEN 325 MG PO TABS: 1000.0000 mg | ORAL_TABLET | Freq: Two times a day (BID) | ORAL | Status: DC | PRN

## 2018-02-13 MED ORDER — VITAMIN D (ERGOCALCIFEROL) 1.25 MG (50000 UNIT) PO CAPS: 50000.0000 [IU] | ORAL_CAPSULE | ORAL | Status: DC

## 2018-02-13 MED ORDER — CARBIDOPA-LEVODOPA 25-100 MG PO TABS
2.0000 | ORAL_TABLET | Freq: Four times a day (QID) | ORAL | Status: DC
  Administered 2018-02-13 – 2018-02-16 (×10): 2 via ORAL
  Filled 2018-02-13 (×15): qty 2

## 2018-02-13 NOTE — Evaluation (Signed)
Physical Therapy Evaluation Patient Details Name: Jillian Vaughn MRN: [id number] DOB: 10/05/1945 Today's Date: 02/13/2018   History of Present Illness  Pt is a 72 y/o F who presented with cough, decreased oral intake.  Pt found to have sepsis, Bil pneumonia and acute hypoxic respiratory failure.  Pt's PMH includes dementia, Parkinson's Disease.      Clinical Impression  Pt admitted with above diagnosis. Pt currently with functional limitations due to the deficits listed below (see PT Problem List). Mrs. Durfey was non-communicative this session, whereas husband and daughter reporting that pt is normally able to speak in full sentences. Pt has not had PD medication that she typically takes at home, RN to ask MD about this.  Pt currently not at her baseline and requires total assist for bed mobility, max assist for sit>stand, and mod assist for stand pivot transfers.  Pt not following commands. Husband voices desire for pt to return to PLOF and to be able to return home eventually.  This PT has concerns about the safety of the pt and the husband given the amount of assist currently required.  Given pt's current mobility status, recommending SNF at d/c. SpO2 down to 88% on 2L O2 with activity, back up to low-mid 90s on 2L O2 at rest.  Pt appears to be breathing out of her mouth only. Pt will benefit from skilled PT to increase their independence and safety with mobility to allow discharge to the venue listed below.      Follow Up Recommendations SNF    Equipment Recommendations  3in1 (PT)    Recommendations for Other Services       Precautions / Restrictions Precautions Precautions: Fall Restrictions Weight Bearing Restrictions: No      Mobility  Bed Mobility Overal bed mobility: Needs Assistance Bed Mobility: Supine to Sit     Supine to sit: Total assist;HOB elevated     General bed mobility comments: Assist for all aspects of bed mobility with HOB elevated.  Pt does not assist.    Transfers Overall transfer level: Needs assistance Equipment used: Rolling walker (2 wheeled) Transfers: Sit to/from [facility] Sit to Stand: Max assist Stand pivot transfers: Mod assist       General transfer comment: This PT placed hands on RW (technique used at home with rollator) and provided tactile and verbal cues.  Pt does assist some once sit>stand motion initiated.  To pivot, once this PT moves the RW the pt turns her feet.  Verbal and tactile cues throughout.  One episode of crossover with RLE when pivoting, requiring physical assist to correct.  Assist to remain steady throughout.   Ambulation/Gait             General Gait Details: Not safe to attempt at this time.  Pt not following commands, requires assist to remain steady.   Stairs            Wheelchair Mobility    Modified Rankin (Stroke Patients Only)       Balance Overall balance assessment: Needs assistance;History of Falls Sitting-balance support: No upper extremity supported;Feet supported Sitting balance-Leahy Scale: Poor Sitting balance - Comments: Pt able to sit EOB for ~2 minutes with BUEs and BLEs supported before slowly losing balance posteriorly, requiring assist to correct.  Postural control: Posterior lean Standing balance support: Bilateral upper extremity supported;During functional activity Standing balance-Leahy Scale: Poor Standing balance comment: Pt demonstrates posterior lean despite verbal and tactile cues.  Pt requires BUE support and outside  physical assist for static and dynamic activities.                              Pertinent Vitals/Pain Pain Assessment: Faces Faces Pain Scale: No hurt Pain Location: No signs of pain Pain Intervention(s): Monitored during session    [place] expects to be discharged to:: Private residence Living Arrangements: Spouse/significant other Available Help at Discharge: Family;Available 24  hours/day Type of Home: House Home Access: Stairs to enter Entrance Stairs-Rails: Right Entrance Stairs-Number of Steps: 4 Home Layout: One level Home Equipment: Shower seat;Hand held shower head;Grab bars - tub/shower;Walker - 4 wheels      Prior Function Level of Independence: Needs assistance   Gait / Transfers Assistance Needed: Pt ambulates up to 12 ft with rollator and husband holding onto pt from behind.  Pt has had 2 falls in the past 6 months.    ADL's / Homemaking Assistance Needed: Husband assist with bathing (gets in the shower with the pt to assist), dressing.  Husband does the cooking, cleaning, driving.         Hand Dominance        Extremity/Trunk Assessment   Upper Extremity Assessment Upper Extremity Assessment: Generalized weakness    Lower Extremity Assessment Lower Extremity Assessment: Generalized weakness    Cervical / Trunk Assessment Cervical / Trunk Assessment: Kyphotic  Communication   Communication: Expressive difficulties(pt noncommunicative throughout session)  Cognition Arousal/Alertness: Awake/alert Behavior During Therapy: Flat affect Overall Cognitive Status: History of cognitive impairments - at baseline                                 General Comments: Pt difficult to arouse but once awake pt remains alert.  Pt noncommunicative, making it difficult to assess cognition.  Pt not following simple commands.       General Comments General comments (skin integrity, edema, etc.): SpO2 down to 88% on 2L O2 with activity, back up to low-mid 90s on 2L O2 at rest.  Pt appears to be breathing out of her mouth only, RN notified. Per husband, pt takes medication for PD at home.  Per RN, pt does not have any PD medication ordered.  Notified RN that pt typically taking PD medication at home.     Exercises     Assessment/Plan    PT Assessment Patient needs continued PT services  PT Problem List Decreased strength;Decreased activity  tolerance;Decreased balance;Decreased mobility;Decreased coordination;Decreased cognition;Decreased knowledge of use of DME;Decreased safety awareness;Cardiopulmonary status limiting activity       PT Treatment Interventions DME instruction;Gait training;Stair training;Functional mobility training;Therapeutic activities;Therapeutic exercise;Balance training;Neuromuscular re-education;Cognitive remediation;Patient/family education;Wheelchair mobility training    PT Goals (Current goals can be found in the Care Plan section)  Acute Rehab PT Goals Patient Stated Goal: for pt to return to PLOF PT Goal Formulation: With family Time For Goal Achievement: 02/27/18 Potential to Achieve Goals: Fair    Frequency Min 2X/week   Barriers to discharge Other (comment) Question safety for pt and husband with husband serving as only primary caregiver.  Pt with h/o 2 falls in the past 6 months.     Co-evaluation               AM-PAC PT "6 Clicks" Mobility  Outcome Measure Help needed turning from your back to your side while in a flat bed without using bedrails?: Total Help  needed moving from lying on your back to sitting on the side of a flat bed without using bedrails?: Total Help needed moving to and from a bed to a chair (including a wheelchair)?: A Lot Help needed standing up from a chair using your arms (e.g., wheelchair or bedside chair)?: A Lot Help needed to walk in hospital room?: A Lot Help needed climbing 3-5 steps with a railing? : A Lot 6 Click Score: 10    End of Session Equipment Utilized During Treatment: Gait belt;Oxygen Activity Tolerance: Patient limited by fatigue Patient left: in chair;with call bell/phone within reach;with chair alarm set;with family/visitor present Nurse Communication: Mobility status;Other (comment)(SpO2, pt takes PD medication at home at baseline) PT Visit Diagnosis: Unsteadiness on feet (R26.81);Other abnormalities of gait and mobility  (R26.89);Repeated falls (R29.6);Other symptoms and signs involving the nervous system (O87.867)    Time: 6720-9470 PT Time Calculation (min) (ACUTE ONLY): 30 min   Charges:   PT Evaluation $PT Eval Moderate Complexity: 1 Mod PT Treatments $Therapeutic Activity: 8-22 mins        Collie Siad PT, DPT 02/13/2018, 11:59 AM

## 2018-02-13 NOTE — Progress Notes (Signed)
Initial Nutrition Assessment  DOCUMENTATION CODES:   Severe malnutrition in context of chronic illness  INTERVENTION:   Ensure Enlive po TID, each supplement provides 350 kcal and 20 grams of protein  Magic cup TID with meals, each supplement provides 290 kcal and 9 grams of protein  MVI daily   Suspect pt at refeeding risk; recommend monitor K, Mg and P labs daily until stable  NUTRITION DIAGNOSIS:   Severe Malnutrition related to chronic illness(Parkinson's disease, dementia ) as evidenced by severe fat depletion, severe muscle depletion, 9 percent weight loss in 4 months.  GOAL:   Patient will meet greater than or equal to 90% of their needs  MONITOR:   PO intake, Supplement acceptance, Labs, Weight trends, Skin, I & O's  REASON FOR ASSESSMENT:   Other (Comment)(low BMI)    ASSESSMENT:   72 y.o. female with a known history of dementia, arthritis, Parkinson's disease admitted with PNA and sepsis   RD familiar with this pt from multiple previous admits. Pt with poor appetite and oral intake at baseline. Pt does drink some Ensure and will eat Magic Cups. Per chart, pt ate 85% of her breakfast this morning which included oatmeal, scrambled eggs, [place] sausage and biscuit. Per chart, pt has lost 7lbs(9%) over the past 4 months. RD will order supplements and MVI to help pt meet her estimated needs. Suspect pt at refeeding risk; recommend monitor K, Mg and P labs daily until stable.   Medications reviewed and include: lovenox, NaCl w/ KCl @75ml /hr, azithromycin, ceftriaxone   Labs reviewed: Na 149(H), K 3.2(L), BUN 29(H) WBC- 20.8(H)  NUTRITION - FOCUSED PHYSICAL EXAM:    Most Recent Value  Orbital Region  Severe depletion  Upper Arm Region  Severe depletion  Thoracic and Lumbar Region  Severe depletion  Buccal Region  Severe depletion  Temple Region  Severe depletion  Clavicle Bone Region  Severe depletion  Clavicle and Acromion Bone Region  Severe depletion   Scapular Bone Region  Severe depletion  Dorsal Hand  Severe depletion  Patellar Region  Severe depletion  Anterior Thigh Region  Severe depletion  Posterior Calf Region  Severe depletion  Edema (RD Assessment)  None  Hair  Reviewed  Eyes  Reviewed  Mouth  Reviewed  Skin  Reviewed  Nails  Reviewed     Diet Order:   Diet Order            DIET DYS 3 Room service appropriate? No; Fluid consistency: Thin  Diet effective now             EDUCATION NEEDS:   No education needs have been identified at this time  Skin:  Skin Assessment: Reviewed RN Assessment(ecchymosis )  Last BM:  pta  Height:   Ht Readings from Last 1 Encounters:  02/12/18 5\' 3"  (1.6 m)    Weight:   Wt Readings from Last 1 Encounters:  02/12/18 33.5 kg    Ideal Body Weight:  52.3 kg  BMI:  Body mass index is 13.08 kg/m.  Estimated Nutritional Needs:   Kcal:  1100-1300kcal/day   Protein:  50-57g/day   Fluid:  800-938ml/day   Koleen Distance MS, RD, LDN Pager #- [phone] Office#- [phone] After Hours Pager: [phone]

## 2018-02-13 NOTE — Progress Notes (Addendum)
[place] [facility] [place] NAME: Jillian Vaughn    MR#:  [id number]  DATE OF BIRTH:  1945/10/25  SUBJECTIVE:  CHIEF COMPLAINT: Patient is sleeping but arousable f, clinically better than yesterday.  Sister and brother at bedside  REVIEW OF SYSTEMS:  Unobtainable as the patient is demented  DRUG ALLERGIES:  No Known Allergies  VITALS:  Blood pressure 140/80, pulse 86, temperature (!) 97.4 F (36.3 C), temperature source Oral, resp. rate 16, height 5\' 3"  (1.6 m), weight 33.5 kg, SpO2 98 %.  PHYSICAL EXAMINATION:  GENERAL:  72 y.o.-year-old patient lying in the bed with no acute distress.  EYES: Pupils equal, round, reactive to light and accommodation. No scleral icterus. Extraocular muscles intact.  HEENT: Head atraumatic, normocephalic. Oropharynx and nasopharynx clear.  NECK:  Supple, no jugular venous distention. No thyroid enlargement, no tenderness.  LUNGS: Mod  breath sounds bilaterally, no wheezing, rales,rhonchi or crepitation. No use of accessory muscles of respiration.  CARDIOVASCULAR: S1, S2 normal. No murmurs, rubs, or gallops.  ABDOMEN: Soft, nontender, nondistended. Bowel sounds present. No organomegaly or mass.  EXTREMITIES: No pedal edema, cyanosis, or clubbing.  NEUROLOGIC: Arousable, disoriented sensation intact. Gait not checked.  PSYCHIATRIC: The patient is arousable SKIN: No obvious rash, lesion, or ulcer.    LABORATORY PANEL:   CBC Recent Labs  Lab 02/13/18 0503  WBC 20.8*  HGB 11.0*  HCT 34.5*  PLT 339   ------------------------------------------------------------------------------------------------------------------  Chemistries  Recent Labs  Lab 02/12/18 1456 02/13/18 0503  NA 149* 149*  K 3.0* 3.2*  CL 114* 118*  CO2 24 23  GLUCOSE 155* 128*  BUN 35* 29*  CREATININE 1.29* 0.97  CALCIUM 9.1 8.3*  AST 19  --   ALT 21  --   ALKPHOS 78  --   BILITOT 1.0  --     ------------------------------------------------------------------------------------------------------------------  Cardiac Enzymes Recent Labs  Lab 02/12/18 1456  TROPONINI <0.03   ------------------------------------------------------------------------------------------------------------------  RADIOLOGY:  Dg Chest 2 View  Result Date: 02/12/2018 CLINICAL DATA:  Cough and shortness of breath EXAM: CHEST - 2 VIEW COMPARISON:  October 04, 2017 FINDINGS: Left perihilar infiltrate. Infiltrate in medial right lung base. The heart, hila, mediastinum, lungs, and pleura are otherwise unremarkable. IMPRESSION: Bilateral infiltrates as above, worrisome for pneumonia given history. Recommend follow-up chest x-ray to ensure resolution. Electronically Signed   By: Dorise Bullion III M.D   On: 02/12/2018 15:22    EKG:   Orders placed or performed during the hospital encounter of 02/12/18  . ED EKG  . ED EKG  . EKG 12-Lead  . EKG 12-Lead  . EKG 12-Lead  . EKG 12-Lead    ASSESSMENT AND PLAN:   *Sepsis secondary to bilateral community-acquired pneumonia with  acute hypoxic respiratory failure Clinically improving Continue IV Rocephin and azithromycin Wean off oxygen as tolerated, keep saturations over 90%. Influenza negative Blood cultures are negative so far and urine culture pending  *Hypernatremia secondary to dehydration with dementia.   No improvement with half-normal saline.  We will start D5W  *History of Parkinson's disease resume Sinemet  *Hypokalemia.  Will replace through IV.  Recheck in a.m.  *Dementia.  Monitor for inpatient delirium.  *DVT prophylaxis with Lovenox   PT is recommending skilled nursing facility   All the records are reviewed and case discussed with Care Management/Social Workerr. Management plans discussed with the patient, family and they are in agreement.  CODE STATUS:   TOTAL TIME TAKING CARE OF  THIS PATIENT: 35 minutes.   POSSIBLE  D/C IN 2  DAYS, DEPENDING ON CLINICAL CONDITION.  Note: This dictation was prepared with Dragon dictation along with smaller phrase technology. Any transcriptional errors that result from this process are unintentional.   Nicholes Mango M.D on 02/13/2018 at 4:13 PM  Between 7am to 6pm - Pager - [phone] After 6pm go to [email or web address] - password [email or web address] [facility]  Office  [phone]  CC: Primary care physician; Lenard Simmer, MD

## 2018-02-14 LAB — URINE CULTURE: Culture: NO GROWTH

## 2018-02-14 LAB — BASIC METABOLIC PANEL
Anion gap: 6 (ref 5–15)
BUN: 23 mg/dL (ref 8–23)
CO2: 25 mmol/L (ref 22–32)
Calcium: 8.4 mg/dL — ABNORMAL LOW (ref 8.9–10.3)
Chloride: 112 mmol/L — ABNORMAL HIGH (ref 98–111)
Creatinine, Ser: 0.76 mg/dL (ref 0.44–1.00)
GFR calc Af Amer: >60 mL/min (ref >60)
GFR calc non Af Amer: >60 mL/min (ref >60)
Glucose, Bld: 107 mg/dL — ABNORMAL HIGH (ref 70–99)
POTASSIUM: 3.4 mmol/L — AB (ref 3.5–5.1)
Sodium: 143 mmol/L (ref 135–145)

## 2018-02-14 LAB — STREP PNEUMONIAE URINARY ANTIGEN: Strep Pneumo Urinary Antigen: POSITIVE — AB

## 2018-02-14 MED ORDER — AZITHROMYCIN 250 MG PO TABS
500.0000 mg | ORAL_TABLET | Freq: Every day | ORAL | Status: DC
  Administered 2018-02-14 – 2018-02-15 (×2): 500 mg via ORAL
  Filled 2018-02-14 (×2): qty 2

## 2018-02-14 MED ORDER — METHYLPREDNISOLONE SODIUM SUCC 125 MG IJ SOLR
60.0000 mg | INTRAMUSCULAR | Status: DC
  Administered 2018-02-14 – 2018-02-15 (×2): 60 mg via INTRAVENOUS
  Filled 2018-02-14 (×2): qty 2

## 2018-02-14 MED ORDER — LORAZEPAM 0.5 MG PO TABS
0.5000 mg | ORAL_TABLET | Freq: Four times a day (QID) | ORAL | Status: DC | PRN
  Administered 2018-02-14: 0.5 mg via ORAL
  Filled 2018-02-14 (×2): qty 1

## 2018-02-14 NOTE — Progress Notes (Deleted)
[place] at [place] NAME: Jillian Vaughn    MR#:  [id number]  DATE OF BIRTH:  Dec 13, 1945  SUBJECTIVE:  Poor historian. Has dementia Sisters at bedside  Some worsening of resp status today REVIEW OF SYSTEMS:  Unobtainable as the patient has dementia  DRUG ALLERGIES:  No Known Allergies  VITALS:  Blood pressure (!) 159/88, pulse 97, temperature (!) 97.5 F (36.4 C), temperature source Axillary, resp. rate 20, height 5\' 3"  (1.6 m), weight 35.7 kg, SpO2 97 %.  PHYSICAL EXAMINATION:  GENERAL:  72 y.o.-year-old patient lying in the bed with no acute distress.  EYES: Pupils equal, round, reactive to light and accommodation. No scleral icterus. Extraocular muscles intact.  HEENT: Head atraumatic, normocephalic. Oropharynx and nasopharynx clear. But dry NECK:  Supple, no jugular venous distention. No thyroid enlargement, no tenderness.  LUNGS: Bilateral wheezing CARDIOVASCULAR: S1, S2 normal. No murmurs, rubs, or gallops.  ABDOMEN: Soft, nontender, nondistended. Bowel sounds present. No organomegaly or mass.  EXTREMITIES: No pedal edema, cyanosis, or clubbing.  NEUROLOGIC: Arousable, disoriented sensation intact. Gait not checked.  PSYCHIATRIC: The patient is awake SKIN: No obvious rash, lesion, or ulcer.   LABORATORY PANEL:   CBC Recent Labs  Lab 02/13/18 0503  WBC 20.8*  HGB 11.0*  HCT 34.5*  PLT 339   ------------------------------------------------------------------------------------------------------------------  Chemistries  Recent Labs  Lab 02/12/18 1456  02/14/18 0429  NA 149*   < > 143  K 3.0*   < > 3.4*  CL 114*   < > 112*  CO2 24   < > 25  GLUCOSE 155*   < > 107*  BUN 35*   < > 23  CREATININE 1.29*   < > 0.76  CALCIUM 9.1   < > 8.4*  AST 19  --   --   ALT 21  --   --   ALKPHOS 78  --   --   BILITOT 1.0  --   --    < > = values in this interval not displayed.    ------------------------------------------------------------------------------------------------------------------  Cardiac Enzymes Recent Labs  Lab 02/12/18 1456  TROPONINI <0.03   ------------------------------------------------------------------------------------------------------------------  RADIOLOGY:  Dg Chest 2 View  Result Date: 02/12/2018 CLINICAL DATA:  Cough and shortness of breath EXAM: CHEST - 2 VIEW COMPARISON:  October 04, 2017 FINDINGS: Left perihilar infiltrate. Infiltrate in medial right lung base. The heart, hila, mediastinum, lungs, and pleura are otherwise unremarkable. IMPRESSION: Bilateral infiltrates as above, worrisome for pneumonia given history. Recommend follow-up chest x-ray to ensure resolution. Electronically Signed   By: Dorise Bullion III M.D   On: 02/12/2018 15:22    EKG:   Orders placed or performed during the hospital encounter of 02/12/18  . ED EKG  . ED EKG  . EKG 12-Lead  . EKG 12-Lead  . EKG 12-Lead  . EKG 12-Lead    ASSESSMENT AND PLAN:   *Sepsis secondary to bilateral community-acquired pneumonia with acute hypoxic respiratory failure Continue IV Rocephin and azithromycin Wean off oxygen as tolerated, keep saturations over 90%. Influenza negative Blood cultures are negative Worsening resp status. Stat nebulizer given. Solumedrol started and ativan added for anxiety  *Hypernatremia secondary to dehydration with dementia.   On D5 W  *History of Parkinson's disease resume Sinemet  *Hypokalemia.  Replaced  *Dementia.  Monitor for inpatient delirium.  *DVT prophylaxis with Lovenox  All the records are reviewed and case discussed with Care Management/Social Workerr. Management plans discussed with the  patient, family and they are in agreement.  CODE STATUS:   TOTAL TIME TAKING CARE OF THIS PATIENT: 35 minutes.   POSSIBLE D/C IN 2-3  DAYS, DEPENDING ON CLINICAL CONDITION.  Note: This dictation was prepared with  Dragon dictation along with smaller phrase technology. Any transcriptional errors that result from this process are unintentional.   Neita Carp M.D on 02/14/2018 at 2:21 PM  Between 7am to 6pm - Pager - [phone]  After 6pm go to [email or web address] - password [email or web address] [facility]  Office  [phone]  CC: Primary care physician; Lenard Simmer, MD

## 2018-02-14 NOTE — Progress Notes (Signed)
[place] at [place] NAME: Shelbi Vaccaro    MR#:  [id number]  DATE OF BIRTH:  1945/04/09  SUBJECTIVE:  Poor historian. Has dementia Sisters at bedside  worsening of resp status today. In resp distress REVIEW OF SYSTEMS:  Unobtainable as the patient has dementia  DRUG ALLERGIES:  No Known Allergies  VITALS:  Blood pressure (!) 159/88, pulse 97, temperature (!) 97.5 F (36.4 C), temperature source Axillary, resp. rate 20, height 5\' 3"  (1.6 m), weight 35.7 kg, SpO2 97 %.  PHYSICAL EXAMINATION:  GENERAL:  72 y.o.-year-old patient lying in the bed with resp distress EYES: Pupils equal, round, reactive to light and accommodation. No scleral icterus. Extraocular muscles intact.  HEENT: Head atraumatic, normocephalic. Oropharynx and nasopharynx clear. But dry NECK:  Supple, no jugular venous distention. No thyroid enlargement, no tenderness.  LUNGS: Bilateral wheezing CARDIOVASCULAR: S1, S2 normal. No murmurs, rubs, or gallops.  ABDOMEN: Soft, nontender, nondistended. Bowel sounds present. No organomegaly or mass.  EXTREMITIES: No pedal edema, cyanosis, or clubbing.  NEUROLOGIC: Arousable, disoriented sensation intact. Gait not checked.  PSYCHIATRIC: The patient is awake SKIN: No obvious rash, lesion, or ulcer.   LABORATORY PANEL:   CBC Recent Labs  Lab 02/13/18 0503  WBC 20.8*  HGB 11.0*  HCT 34.5*  PLT 339   ------------------------------------------------------------------------------------------------------------------  Chemistries  Recent Labs  Lab 02/12/18 1456  02/14/18 0429  NA 149*   < > 143  K 3.0*   < > 3.4*  CL 114*   < > 112*  CO2 24   < > 25  GLUCOSE 155*   < > 107*  BUN 35*   < > 23  CREATININE 1.29*   < > 0.76  CALCIUM 9.1   < > 8.4*  AST 19  --   --   ALT 21  --   --   ALKPHOS 78  --   --   BILITOT 1.0  --   --    < > = values in this interval not displayed.    ------------------------------------------------------------------------------------------------------------------  Cardiac Enzymes Recent Labs  Lab 02/12/18 1456  TROPONINI <0.03   ------------------------------------------------------------------------------------------------------------------  RADIOLOGY:  Dg Chest 2 View  Result Date: 02/12/2018 CLINICAL DATA:  Cough and shortness of breath EXAM: CHEST - 2 VIEW COMPARISON:  October 04, 2017 FINDINGS: Left perihilar infiltrate. Infiltrate in medial right lung base. The heart, hila, mediastinum, lungs, and pleura are otherwise unremarkable. IMPRESSION: Bilateral infiltrates as above, worrisome for pneumonia given history. Recommend follow-up chest x-ray to ensure resolution. Electronically Signed   By: Dorise Bullion III M.D   On: 02/12/2018 15:22    EKG:   Orders placed or performed during the hospital encounter of 02/12/18  . ED EKG  . ED EKG  . EKG 12-Lead  . EKG 12-Lead  . EKG 12-Lead  . EKG 12-Lead    ASSESSMENT AND PLAN:   *Sepsis secondary to bilateral community-acquired pneumonia with acute hypoxic respiratory failure Continue IV Rocephin and azithromycin Wean off oxygen as tolerated, keep saturations over 90%. Influenza negative Blood cultures are negative Worsening resp status. Stat nebulizer given. Solumedrol started and ativan added for anxiety  *Hypernatremia secondary to dehydration with dementia.   On D5 W  *History of Parkinson's disease resume Sinemet  *Hypokalemia.  Replaced  *Dementia.  Monitor for inpatient delirium.  *DVT prophylaxis with Lovenox  Called and discussed with [employer] . She confirms DNR. Continue IV abx. Continue dysphagia diet  understanding the aspiration risk. Transition to comfort measures if worsening  All the records are reviewed and case discussed with Care Management/Social Workerr. Management plans discussed with the patient, family and they are in  agreement.  CODE STATUS: DNR  TOTAL CRITICAL CARE  TIME TAKING CARE OF THIS PATIENT: 35 minutes.   POSSIBLE D/C IN 2-3  DAYS, DEPENDING ON CLINICAL CONDITION.  Note: This dictation was prepared with Dragon dictation along with smaller phrase technology. Any transcriptional errors that result from this process are unintentional.  Neita Carp M.D on 02/14/2018 at 2:25 PM  Between 7am to 6pm - Pager - [phone]  After 6pm go to [email or web address] - password [email or web address] [facility]  Office  [phone]  CC: Primary care physician; Lenard Simmer, MD

## 2018-02-14 NOTE — Progress Notes (Signed)
PHARMACIST - PHYSICIAN COMMUNICATION  CONCERNING: Antibiotic IV to Oral Route Change Policy  RECOMMENDATION: This patient is receiving Azithromycin 500mg  by the intravenous route.  Based on criteria approved by the Pharmacy and Therapeutics Committee, the antibiotic(s) is/are being converted to the equivalent oral dose form(s).   DESCRIPTION: These criteria include:  Patient being treated for a respiratory tract infection, urinary tract infection, cellulitis or clostridium difficile associated diarrhea if on metronidazole  The patient is not neutropenic and does not exhibit a GI malabsorption state  The patient is eating (either orally or via tube) and/or has been taking other orally administered medications for a least 24 hours  The patient is improving clinically and has a Tmax < 100.5  If you have questions about this conversion, please contact the [place], PharmD, BCPS Clinical Pharmacist 02/14/2018 9:12 AM

## 2018-02-15 LAB — BASIC METABOLIC PANEL
Anion gap: 10 (ref 5–15)
BUN: 22 mg/dL (ref 8–23)
CO2: 26 mmol/L (ref 22–32)
Calcium: 8.5 mg/dL — ABNORMAL LOW (ref 8.9–10.3)
Chloride: 103 mmol/L (ref 98–111)
Creatinine, Ser: 0.73 mg/dL (ref 0.44–1.00)
GFR calc non Af Amer: >60 mL/min (ref >60)
Glucose, Bld: 123 mg/dL — ABNORMAL HIGH (ref 70–99)
Potassium: 3.9 mmol/L (ref 3.5–5.1)
Sodium: 139 mmol/L (ref 135–145)

## 2018-02-15 LAB — CBC WITH DIFFERENTIAL/PLATELET
Abs Immature Granulocytes: 0.49 K/uL — ABNORMAL HIGH (ref 0.00–0.07)
Basophils Absolute: 0 K/uL (ref 0.0–0.1)
Basophils Relative: 0 %
Eosinophils Absolute: 0 K/uL (ref 0.0–0.5)
Eosinophils Relative: 0 %
HCT: 33.2 % — ABNORMAL LOW (ref 36.0–46.0)
Hemoglobin: 11.1 g/dL — ABNORMAL LOW (ref 12.0–15.0)
Immature Granulocytes: 3 %
Lymphocytes Relative: 4 %
Lymphs Abs: 0.6 K/uL — ABNORMAL LOW (ref 0.7–4.0)
MCH: 31.4 pg (ref 26.0–34.0)
MCHC: 33.4 g/dL (ref 30.0–36.0)
MCV: 93.8 fL (ref 80.0–100.0)
Monocytes Absolute: 0.4 K/uL (ref 0.1–1.0)
Monocytes Relative: 3 %
Neutro Abs: 15 K/uL — ABNORMAL HIGH (ref 1.7–7.7)
Neutrophils Relative %: 90 %
Platelets: 470 K/uL — ABNORMAL HIGH (ref 150–400)
RBC: 3.54 MIL/uL — ABNORMAL LOW (ref 3.87–5.11)
RDW: 11.9 % (ref 11.5–15.5)
WBC: 16.6 K/uL — ABNORMAL HIGH (ref 4.0–10.5)
nRBC: 0 % (ref 0.0–0.2)

## 2018-02-15 MED ORDER — GLYCOPYRROLATE 1 MG PO TABS
1.0000 mg | ORAL_TABLET | Freq: Three times a day (TID) | ORAL | Status: DC
  Filled 2018-02-15 (×2): qty 1

## 2018-02-15 MED ORDER — SCOPOLAMINE 1 MG/3DAYS TD PT72
1.0000 | MEDICATED_PATCH | TRANSDERMAL | Status: DC
  Administered 2018-02-15: 1.5 mg via TRANSDERMAL
  Filled 2018-02-15: qty 1

## 2018-02-15 NOTE — Progress Notes (Addendum)
RNCM team spoke with PT and spouse Friday 12/27 regarding disposition. Spouse, Jillian Vaughn is primary caregiver and according to home was able to completely care for her at home before the hospitalization with the assistance of hospice care at home. Patient uses rolling walker at baseline. PT rec SNF and a bedside commode. Spouse originally declined SNF but unfortunately patient has declined and per MD comfort care is now a possibility. MD attempting to reach the daughter to talk about transition of care. Per MD patient is receiving hospice at home but there are communication barriers with the spouse as he does not seem to totally grasp the situation, this is also reflected in previous care management and hospice notes. I spoke with patients sister and she ask the I not speak with Jillian Vaughn about the care plans as he "does not get it" and that everything should be run through the daughter. Again, MD attempting to reach daughter to coordinate disposition. Our team will continue to follow to assist with discharge.    *Update 1330*  MD was able to speak with daughter and it is expected that depending on patient condition patient will either discharge to hospice home or home with hospice with resumption of hospice care with [facility].

## 2018-02-15 NOTE — Progress Notes (Signed)
Patient rested well overnight, and had decreased work of breathing after prn ativan and nebulizer treatment. Based on this nurses observations it is unclear if patient is aspirating medications and fluids. Patient cough appears stronger after these interventions. MD paged for speech consult. Suction placed at bedside for high risk of aspiration.

## 2018-02-15 NOTE — Progress Notes (Addendum)
[place] at [place] NAME: Jillian Vaughn    MR#:  [id number]  DATE OF BIRTH:  [birth year]  SUBJECTIVE:   Continues to worsen.  Patient is extremely drowsy.  No oral intake.  On 4 L oxygen.  Sister at bedside.  REVIEW OF SYSTEMS:  Unobtainable as the patient has dementia  DRUG ALLERGIES:  No Known Allergies  VITALS:  Blood pressure 113/61, pulse 90, temperature 97.8 F (36.6 C), temperature source Oral, resp. rate 18, height 5\' 3"  (1.6 m), weight 34.7 kg, SpO2 100 %.  PHYSICAL EXAMINATION:  GENERAL:  72 y.o.-year-old patient lying in the bed , looks critically ill EYES: Pupils equal, round, reactive to light and accommodation. No scleral icterus. Extraocular muscles intact.  HEENT: Head atraumatic, normocephalic. Oropharynx and nasopharynx clear. But dry NECK:  Supple, no jugular venous distention. No thyroid enlargement, no tenderness.  LUNGS: Bilateral wheezing .  Poor air entry CARDIOVASCULAR: S1, S2 normal. No murmurs, rubs, or gallops.  ABDOMEN: Soft, nontender, nondistended. Bowel sounds present. No organomegaly or mass.  EXTREMITIES: No pedal edema, cyanosis, or clubbing.  NEUROLOGIC: Arousable, disoriented sensation intact. Gait not checked.  PSYCHIATRIC: The patient is lethargic SKIN: No obvious rash, lesion, or ulcer.   LABORATORY PANEL:   CBC Recent Labs  Lab 02/15/18 0413  WBC 16.6*  HGB 11.1*  HCT 33.2*  PLT 470*   ------------------------------------------------------------------------------------------------------------------  Chemistries  Recent Labs  Lab 02/12/18 1456  02/15/18 0413  NA 149*   < > 139  K 3.0*   < > 3.9  CL 114*   < > 103  CO2 24   < > 26  GLUCOSE 155*   < > 123*  BUN 35*   < > 22  CREATININE 1.29*   < > 0.73  CALCIUM 9.1   < > 8.5*  AST 19  --   --   ALT 21  --   --   ALKPHOS 78  --   --   BILITOT 1.0  --   --    < > = values in this interval not displayed.    ------------------------------------------------------------------------------------------------------------------  Cardiac Enzymes Recent Labs  Lab 02/12/18 1456  TROPONINI <0.03   ------------------------------------------------------------------------------------------------------------------  RADIOLOGY:  No results found.  EKG:   Orders placed or performed during the hospital encounter of 02/12/18  . ED EKG  . ED EKG  . EKG 12-Lead  . EKG 12-Lead  . EKG 12-Lead  . EKG 12-Lead    ASSESSMENT AND PLAN:   *Sepsis secondary to bilateral community-acquired pneumonia with acute hypoxic respiratory failure On IV Rocephin and azithromycin Patient continues to worsen and now is on 4 L oxygen. Influenza negative Blood cultures are negative  As per my discussion with daughter yesterday she was leaning towards comfort care if patient worsens.  Tried calling the daughter and left voicemail.  Waiting for callback.  *Hypernatremia secondary to dehydration with dementia.   On D5 W  *History of Parkinson's disease Resumed Sinemet  *Hypokalemia.  Replaced  *Dementia.    *DVT prophylaxis with Lovenox  All the records are reviewed and case discussed with Care Management/Social Workerr. Management plans discussed with the patient, family and they are in agreement.  CODE STATUS: DNR  TOTAL CRITICAL CARE  TIME TAKING CARE OF THIS PATIENT: 35 minutes.   Note: This dictation was prepared with Dragon dictation along with smaller phrase technology. Any transcriptional errors that result from this process are unintentional.  Darrick Greenlaw  Arlice Colt M.D on 02/15/2018 at 10:07 AM  Between 7am to 6pm - Pager - [phone]  After 6pm go to [email or web address] - password [email or web address] [facility]  Office  [phone]  CC: Primary care physician; Lenard Simmer, MD     spoke with Daughter Fredderick Severance. Patient spoke with her  On the phone later after my evaluation.  Will continue IV abx. Advised daughter to come in to visit from [place] as patient can decline quickly. Palliative care team consultation.

## 2018-02-16 DIAGNOSIS — J189 Pneumonia, unspecified organism: Secondary | ICD-10-CM

## 2018-02-16 DIAGNOSIS — R6520 Severe sepsis without septic shock: Secondary | ICD-10-CM

## 2018-02-16 DIAGNOSIS — Z7189 Other specified counseling: Secondary | ICD-10-CM

## 2018-02-16 DIAGNOSIS — Z515 Encounter for palliative care: Secondary | ICD-10-CM

## 2018-02-16 DIAGNOSIS — Z66 Do not resuscitate: Secondary | ICD-10-CM

## 2018-02-16 DIAGNOSIS — A419 Sepsis, unspecified organism: Principal | ICD-10-CM

## 2018-02-16 DIAGNOSIS — J9601 Acute respiratory failure with hypoxia: Secondary | ICD-10-CM

## 2018-02-16 MED ORDER — MORPHINE SULFATE (PF) 2 MG/ML IV SOLN: 2.0000 mg | INTRAVENOUS | Status: DC | PRN

## 2018-02-16 MED ORDER — SODIUM CHLORIDE 0.9% FLUSH
3.0000 mL | Freq: Two times a day (BID) | INTRAVENOUS | Status: DC
  Administered 2018-02-16 – 2018-02-17 (×3): 3 mL via INTRAVENOUS

## 2018-02-16 MED ORDER — GLYCOPYRROLATE 0.2 MG/ML IJ SOLN
0.2000 mg | INTRAMUSCULAR | Status: DC | PRN
  Administered 2018-02-17: 0.2 mg via INTRAVENOUS
  Filled 2018-02-16 (×4): qty 1

## 2018-02-16 MED ORDER — LORAZEPAM 2 MG/ML IJ SOLN
0.5000 mg | Freq: Four times a day (QID) | INTRAMUSCULAR | Status: DC | PRN
  Administered 2018-02-16: 0.5 mg via INTRAVENOUS
  Filled 2018-02-16: qty 1

## 2018-02-16 MED ORDER — ACETAMINOPHEN 325 MG PO TABS: 650.0000 mg | ORAL_TABLET | Freq: Four times a day (QID) | ORAL | Status: DC | PRN

## 2018-02-16 MED ORDER — LORAZEPAM 1 MG PO TABS: 1.0000 mg | ORAL_TABLET | ORAL | Status: DC | PRN

## 2018-02-16 MED ORDER — ONDANSETRON HCL 4 MG/2ML IJ SOLN: 4.0000 mg | Freq: Four times a day (QID) | INTRAMUSCULAR | Status: DC | PRN

## 2018-02-16 MED ORDER — MORPHINE SULFATE (CONCENTRATE) 10 MG/0.5ML PO SOLN: 5.0000 mg | ORAL | Status: DC | PRN

## 2018-02-16 MED ORDER — GLYCOPYRROLATE 0.2 MG/ML IJ SOLN
0.2000 mg | INTRAMUSCULAR | Status: DC | PRN
  Filled 2018-02-16: qty 1

## 2018-02-16 MED ORDER — GLYCOPYRROLATE 1 MG PO TABS
1.0000 mg | ORAL_TABLET | ORAL | Status: DC | PRN
  Filled 2018-02-16: qty 1

## 2018-02-16 MED ORDER — LORAZEPAM 2 MG/ML IJ SOLN
1.0000 mg | INTRAMUSCULAR | Status: DC | PRN
  Administered 2018-02-17 – 2018-02-18 (×2): 1 mg via INTRAVENOUS
  Filled 2018-02-16 (×2): qty 1

## 2018-02-16 MED ORDER — SODIUM CHLORIDE 0.9% FLUSH: 3.0000 mL | INTRAVENOUS | Status: DC | PRN

## 2018-02-16 MED ORDER — MORPHINE SULFATE (CONCENTRATE) 10 MG/0.5ML PO SOLN
5.0000 mg | ORAL | Status: DC | PRN
  Administered 2018-02-17: 5 mg via ORAL
  Filled 2018-02-16: qty 1

## 2018-02-16 MED ORDER — SODIUM CHLORIDE 0.9 % IV SOLN: 250.0000 mL | INTRAVENOUS | Status: DC | PRN

## 2018-02-16 MED ORDER — ACETAMINOPHEN 650 MG RE SUPP: 650.0000 mg | Freq: Four times a day (QID) | RECTAL | Status: DC | PRN

## 2018-02-16 MED ORDER — LORAZEPAM 2 MG/ML PO CONC
1.0000 mg | ORAL | Status: DC | PRN
  Filled 2018-02-16: qty 0.5

## 2018-02-16 MED ORDER — ONDANSETRON 4 MG PO TBDP: 4.0000 mg | ORAL_TABLET | Freq: Four times a day (QID) | ORAL | Status: DC | PRN

## 2018-02-16 NOTE — Clinical Social Work Note (Addendum)
Physician made CSW aware that patient's daughter is coming into town tomorrow and patient is to be made comfort care and potential hospice home. Will need to speak with daughter when she is in to ask hospice preference. Shela Leff MSW,LCSW [phone]

## 2018-02-16 NOTE — Progress Notes (Addendum)
[place] at [place] NAME: Jillian Vaughn    MR#:  [id number]  DATE OF BIRTH:  20-Oct-1945  SUBJECTIVE:   Continues to worsen. PRN suction being used.  REVIEW OF SYSTEMS:  Unobtainable as the patient has dementia.  DRUG ALLERGIES:  No Known Allergies  VITALS:  Blood pressure (!) 146/85, pulse 79, temperature (!) 97.5 F (36.4 C), temperature source Oral, resp. rate 16, height 5\' 3"  (1.6 m), weight 36.7 kg, SpO2 98 %.  PHYSICAL EXAMINATION:  GENERAL:  72 y.o.-year-old patient lying in the bed , looks critically ill. EYES: Pupils equal, round, reactive to light and accommodation. No scleral icterus. Extraocular muscles intact.  HEENT: Head atraumatic, normocephalic. Oropharynx and nasopharynx clear. But dry NECK:  Supple, no jugular venous distention. No thyroid enlargement, no tenderness.  LUNGS: Bilateral wheezing .  Poor air entry CARDIOVASCULAR: S1, S2 normal. No murmurs, rubs, or gallops.  ABDOMEN: Soft, nontender, nondistended. Bowel sounds present.  EXTREMITIES: No pedal edema, cyanosis, or clubbing.  NEUROLOGIC: Arousable, disoriented  PSYCHIATRIC: The patient is lethargic SKIN: No obvious rash, lesion, or ulcer.   LABORATORY PANEL:   CBC Recent Labs  Lab 02/15/18 0413  WBC 16.6*  HGB 11.1*  HCT 33.2*  PLT 470*   ------------------------------------------------------------------------------------------------------------------  Chemistries  Recent Labs  Lab 02/12/18 1456  02/15/18 0413  NA 149*   < > 139  K 3.0*   < > 3.9  CL 114*   < > 103  CO2 24   < > 26  GLUCOSE 155*   < > 123*  BUN 35*   < > 22  CREATININE 1.29*   < > 0.73  CALCIUM 9.1   < > 8.5*  AST 19  --   --   ALT 21  --   --   ALKPHOS 78  --   --   BILITOT 1.0  --   --    < > = values in this interval not displayed.    ------------------------------------------------------------------------------------------------------------------  Cardiac Enzymes Recent Labs  Lab 02/12/18 1456  TROPONINI <0.03   ------------------------------------------------------------------------------------------------------------------  RADIOLOGY:  No results found.  EKG:   Orders placed or performed during the Vaughn encounter of 02/12/18  . ED EKG  . ED EKG  . EKG 12-Lead  . EKG 12-Lead  . EKG 12-Lead  . EKG 12-Lead    ASSESSMENT AND PLAN:   *Sepsis secondary to bilateral community-acquired pneumonia with acute hypoxic respiratory failure On IV Rocephin and azithromycin Patient continues to worsen and now is on 5 L oxygen. Influenza negative Blood cultures are negative Will need to discuss further with daughter who is the healthcare power of attorney regarding comfort measures which seems appropriate in this case.  *Hypernatremia secondary to dehydration with dementia.   IV fluids were stopped yesterday  *History of Parkinson's disease Resumed Sinemet  *Hypokalemia.  Replaced  *Dementia.   With acute metabolic encephalopathy  *DVT prophylaxis with Lovenox  All the records are reviewed and case discussed with Care Management/Social Workerr. Management plans discussed with the patient, family and they are in agreement.  CODE STATUS: DNR  TOTAL  TIME TAKING CARE OF THIS PATIENT: 35 minutes.   Note: This dictation was prepared with Dragon dictation along with smaller phrase technology. Any transcriptional errors that result from this process are unintentional.  Neita Carp M.D on 02/16/2018 at 2:53 PM  Between 7am to 6pm - Pager - [phone]  After 6pm go to [email or web address] -  password [email or web address] [facility]  [facility]  Office  [phone]  CC: Primary care physician; Jillian Simmer, MD     spoke with Daughter Jillian Vaughn. Patient spoke with her  On the phone later  after my evaluation. Will continue IV abx. Advised daughter to come in to visit from [place] as patient can decline quickly. Palliative care team consultation.

## 2018-02-16 NOTE — Progress Notes (Signed)
PT Cancellation Note  Patient Details Name: Jillian Vaughn MRN: [id number] DOB: [birth year]   Cancelled Treatment:    Reason Eval/Treat Not Completed: Other (comment).  With discussions of comfort care vs. Hospice this PT had a discussion with pt's sister's who were at pt's bedside.  The sisters requested that PT be held until the pt's daughters Production manager) arrive next date and make decisions.    Collie Siad PT, DPT 02/16/2018, 11:27 AM

## 2018-02-16 NOTE — Progress Notes (Signed)
PT Cancellation Note  Patient Details Name: Jillian Vaughn MRN: [id number] DOB: Mar 18, 1945   Cancelled Treatment:    Reason Eval/Treat Not Completed: Other (comment).  Received discontinue orders from MD. Per chart review, decision has been made to transition to comfort care.  PT will sign off at this time.    Collie Siad PT, DPT 02/16/2018, 4:25 PM

## 2018-02-16 NOTE — Progress Notes (Signed)
Advance care planning  Purpose of Encounter Pneumonia, Goals of care  Parties in Attendance Patient who is confused and unable to make medical decisions.  Daughter(Elizabeth daye) over the phone.  Patient continues to worsen in spite of IV antibiotics.  At this time she is unable to eat.  PRN suction being used.  This is significantly worse from her baseline.  Discussed with daughter that comfort measures as we had discussed earlier seems appropriate at this time.  Daughter also tells me that patient would not be able to return home as her husband has dementia and unable to care for her well.   Patient seems appropriate for hospice home at this time.  Comfort measures initiated. IV abx stopped.  Time spent - 20 minutes

## 2018-02-16 NOTE — Consult Note (Signed)
Consultation Note Date: 02/16/2018   Patient Name: Jillian Vaughn  DOB: 1945/07/16  MRN: [id number]  Age / Sex: 72 y.o., female  PCP: Lenard Simmer, MD Referring Physician: Hillary Bow, MD  Reason for Consultation: Establishing goals of care  HPI/Patient Profile: 72 y.o. female admitted on  02/12/2018 from home with husband with complaints of fever and cough. She has a past medical history of dementia, arthritis, and Parkinson's disease. During her ED course family reported patient has had decreased oral intake. Patient was responding with 1-2 words during her assessment. She has hospice services at home for support. NA 149, K 3.0, BUN 35, Cr 1.29, Albumin 3.0, WBC 20.6. Chest x-ray showed bilateral infiltrates, worrisome for pneumonia. She was started on ceftriaxone and azithromycin post cultures. Since admission patient is showing no signs of improvement. She is worsening and having increased signs of aspiration. Oxygen requirement increased to 4L. Flu and blood cultures have been negative. Palliative Medicine team consulted for goals of care.    Clinical Assessment and Goals of Care: I have reviewed medical records including lab results, imaging, Epic notes, and MAR, received report from the bedside RN, and assessed the patient. I met at the bedside with patient's sisters and spoke to daughter via phone to discuss diagnosis prognosis, [place], EOL wishes, disposition and options. Patient remains nonverbal due to dementia. She is frail and chronically ill appearing. Mouth breathing with minimum response. She is unable to engage in discussions.   I re-introduced Palliative Medicine as specialized medical care for people living with serious illness. It focuses on providing relief from the symptoms and stress of a serious illness. The goal is to improve quality of life for both the patient and the family.  Patient  was receiving hospice care in the home. I also am familiar with patient and her family. I was involved in her care during an admission in August 2019. Daughter verbalized remembrance and appreciation of being involved.   Family is expressing concerns that husband has tried to care for patient in the home the best he can, however, he is suffering from mild dementia himself. Patient is thin and malnutrition. Family reports visiting patient and severe decline in her mental status and po intake. She has not ambulated for several months according to family. When attempting to feed patient would only take several bites and would also be observed coughing after liquids.   We discussed her current illness and what it means in the larger context of her on-going co-morbidities. I discussed specifically patients respiratory, and overall functional and nutritional status. Natural disease trajectory and expectations at EOL were discussed. Family verbalized understanding and their awareness of patient's continuous decline.     The difference between aggressive medical intervention and comfort care was considered in light of the patient's goals of care. I educated family on what comfort care measures would look like. I discussed with them with comfort care, patient would no longer receive aggressive medical interventions such as continuous vital signs, lab work, radiology testing,  or medications not focused on comfort. All care will focus on how the patient is looking and feeling. This will include management of any symptoms that may cause discomfort, pain, shortness of breath, cough, nausea, agitation, anxiety, and/or secretions etc. Symptoms will be managed with medications and other non-pharmacological interventions such as spiritual support if requested, repositioning, music therapy, or therapeutic listening. Family verbalized understanding and appreciation.   Daughter confirms DNR/DNI. Full comfort measures. She is  aware that given patient's decline there is a high likelihood she could pass away in the hospital over the next 24-48hrs.   Hospice and Palliative Care services outpatient were explained and offered. Patient and family verbalized their understanding and awareness of hospice goals and philosophy of care. Daughter requesting to discuss further once she arrives tomorrow in regards to residential hospice.   Questions and concerns were addressed. The family was encouraged to call with questions or concerns.  PMT will continue to support holistically.  Primary Decision Maker:  NEXT OF KIN/DAUGHTER: BETH DAYE    SUMMARY OF RECOMMENDATIONS    DNR/DNI-as confirmed by daughter  Full Comfort Measures  Daughter and Son are arriving in tomorrow from [facility]. Will f/u for further discussion on hospital death vs. Residential hospice need.   Family is aware of patient's decline in health and the possibility of her passing away prior to their arrival tomorrow. Daughter verbalized understanding.   Agree with comfort orders  Robinul PRN for excessive secretions  Morphine PRN for pain/shortness of breath  Ativan PRN for anxiety/agitation   Palliative Medicine team will continue support and follow.   Code Status/Advance Care Planning:  DNR  Symptom Management:   Robinul PRN for excessive secretions  Morphine PRN for pain/shortness of breath  Ativan PRN for anxiety/agitation   Palliative Prophylaxis:   Aspiration, Frequent Pain Assessment and Oral Care  Additional Recommendations (Limitations, Scope, Preferences):  Full Comfort Care  Psycho-social/Spiritual:   Desire for further Chaplaincy support: NO   Prognosis:   Hours - Days-in the setting of poor po intake, severe protein calorie malnutrition, end-stage dementia, Parkinson's disease, decreased mobility, sepsis with bilateral pneumonia, acute hypoxic respiratory failure, non-verbal, and high risk for aspiration.   Discharge  Planning: Anticipated Hospital Death versus residential hospice.       Primary Diagnoses: Present on Admission: . Pneumonia   I have reviewed the medical record, interviewed the patient and family, and examined the patient. The following aspects are pertinent.  Past Medical History:  Diagnosis Date  . Arthritis   . Dementia ([place])   . Parkinson disease [facility])    Social History   Socioeconomic History  . Marital status: Married    Spouse name: Kattaleya Alia  . Number of children: 3  . Years of education: Unknown  . Highest education level: Not on file  Occupational History  . Not on file  Social Needs  . Financial resource strain: Not very hard  . Food insecurity:    Worry: Never true    Inability: Never true  . Transportation needs:    Medical: No    Non-medical: No  Tobacco Use  . Smoking status: Never Smoker  . Smokeless tobacco: Never Used  Substance and Sexual Activity  . Alcohol use: Yes  . Drug use: No  . Sexual activity: Not on file  Lifestyle  . Physical activity:    Days per week: Not on file    Minutes per session: Not on file  . Stress: Not on file  Relationships  .  Social connections:    Talks on phone: Once a week    Gets together: Twice a week    Attends religious service: Never    Active member of club or organization: No    Attends meetings of clubs or organizations: Never    Relationship status: Married  Other Topics Concern  . Not on file  Social History Narrative  . Not on file   No family history on file. Scheduled Meds: . sodium chloride flush  3 mL Intravenous Q12H   Continuous Infusions: . sodium chloride     PRN Meds:.sodium chloride, acetaminophen **OR** acetaminophen, glycopyrrolate **OR** glycopyrrolate **OR** glycopyrrolate, LORazepam **OR** LORazepam **OR** LORazepam, morphine injection, morphine CONCENTRATE **OR** morphine CONCENTRATE, ondansetron **OR** ondansetron (ZOFRAN) IV, sodium chloride flush Medications Prior to  Admission:  Prior to Admission medications   Medication Sig Start Date End Date Taking? Authorizing Provider  acetaminophen (TYLENOL) 650 MG CR tablet Take 1,300 mg by mouth 2 (two) times daily as needed for pain.   Yes [provider]  amantadine (SYMMETREL) 100 MG capsule Take 100 mg by mouth 2 (two) times daily. 03/23/15  Yes [provider]  aspirin 325 MG tablet Take 325 mg by mouth daily.   Yes [provider]  carbidopa-levodopa (SINEMET) 25-100 MG tablet Take 2 tablets by mouth 4 (four) times daily.  03/02/15  Yes [provider]  lubiprostone (AMITIZA) 24 MCG capsule Take 24 mcg by mouth daily with breakfast.   Yes [provider]  meloxicam (MOBIC) 7.5 MG tablet Take 7.5 mg by mouth daily.   Yes [provider]  midodrine (PROAMATINE) 10 MG tablet Take 1 tablet (10 mg total) by mouth 3 (three) times daily with meals. 10/04/17  Yes Epifanio Lesches, MD  mirtazapine (REMERON) 7.5 MG tablet Take 7.5 mg by mouth at bedtime.   Yes [provider]  pravastatin (PRAVACHOL) 40 MG tablet Take 40 mg by mouth at bedtime.   Yes [provider]  Riboflavin (VITAMIN B-2) 50 MG TABS Take 50 mg by mouth daily.   Yes [provider]  rOPINIRole (REQUIP) 0.5 MG tablet Take 0.5 mg by mouth daily.   Yes [provider]  Vitamin D, Ergocalciferol, (DRISDOL) 50000 units CAPS capsule Take 50,000 Units by mouth every Wednesday. 04/18/15  Yes [provider]  zoledronic acid (RECLAST) 5 MG/100ML SOLN injection Inject 5 mg into the vein once.   Yes [provider]  feeding supplement, ENSURE ENLIVE, (ENSURE ENLIVE) LIQD Take 237 mLs by mouth 2 (two) times daily between meals. 10/04/17   Epifanio Lesches, MD   No Known Allergies Review of Systems  Unable to perform ROS: Dementia    Physical Exam Vitals signs and nursing note reviewed.  Constitutional:      Appearance: She is cachectic. She is  ill-appearing.     Comments: Thin, frail, chronically ill appearing   Cardiovascular:     Rate and Rhythm: Normal rate.     Pulses: Decreased pulses.     Heart sounds: Normal heart sounds.  Pulmonary:     Breath sounds: Decreased breath sounds present.     Comments: Congestion, unable to clear throat, mouth breathing  Skin:    General: Skin is warm and dry.     Coloration: Skin is pale.     Findings: Bruising present.     Comments: thin  Neurological:     Mental Status: She is lethargic.     Motor: Atrophy present.  Comments: No verbal response  Psychiatric:        Cognition and Memory: Cognition normal.        Judgment: Judgment normal.     Vital Signs: BP (!) 146/85 (BP Location: Right Arm)   Pulse 79   Temp (!) 97.5 F (36.4 C) (Oral)   Resp 16   Ht '5\' 3"'  (1.6 m)   Wt 36.7 kg   SpO2 98%   BMI 14.33 kg/m  Pain Scale: 0-10   Pain Score: 0-No pain   SpO2: SpO2: 98 % O2 Device:SpO2: 98 % O2 Flow Rate: .O2 Flow Rate (L/min): 4 L/min  IO: Intake/output summary:   Intake/Output Summary (Last 24 hours) at 02/16/2018 1517 Last data filed at 02/16/2018 0211 Gross per 24 hour  Intake 81.84 ml  Output 100 ml  Net -18.16 ml    LBM: Last BM Date: (pt and family unsure) Baseline Weight: Weight: 36.3 kg Most recent weight: Weight: 36.7 kg     Palliative Assessment/Data: PPS 10%   Flowsheet Rows     Most Recent Value  Intake Tab  Referral Department  Hospitalist  Unit at Time of Referral  Med/Surg Unit  Date Notified  02/15/18  Palliative Care Type  Return patient Palliative Care  Reason for referral  Clarify Goals of Care  Date of Admission  02/12/18  # of days IP prior to Palliative referral  3  Clinical Assessment  Psychosocial & Spiritual Assessment  Palliative Care Outcomes      Time In: 1400 Time Out: 1505 Time Total: 65 min  Greater than 50%  of this time was spent counseling and coordinating care related to the above assessment and  plan.  Signed by:  Alda Lea, AGPCNP-BC Palliative Medicine Team  Phone: [phone] Fax: [phone] Pager: [phone] Amion: Bjorn Pippin    Please contact Palliative Medicine Team phone at [phone] for questions and concerns.  For individual provider: See Shea Evans

## 2018-02-16 NOTE — Care Management Important Message (Signed)
Copy of signed IM left with patient in room.  

## 2018-02-17 LAB — CULTURE, BLOOD (ROUTINE X 2)
CULTURE: NO GROWTH
Culture: NO GROWTH
Special Requests: ADEQUATE
Special Requests: ADEQUATE

## 2018-02-17 NOTE — Progress Notes (Signed)
Daily Progress Note   Patient Name: Jillian Vaughn       Date: 02/17/2018 DOB: [birth year]  Age: 72 y.o. MRN#: [id number] Attending Physician: Hillary Bow, MD Primary Care Physician: Lenard Simmer, MD Admit Date: 02/12/2018  Reason for Consultation/Follow-up: Establishing goals of care, Non pain symptom management and Pain control  Subjective:  Patient remains lethargic. Will open eyes spontaneously. Will not follow commands. No verbal response. She appears comfortable. Family is at bedside (daughter, husband, and son).   I spent time with family allowing them to share memories of their mother/wife. Husband somewhat tearful. Son at bedside holding her hand. Family verbalizes their awareness of her decline and their appreciation of the care she is receiving. I briefly reviewed goals of care discussion with family. Family expressed their understanding.   Family was educated on residential hospice. All questions were answered.   Daughter expresses to continue with full comfort care measures. She and her brother expressed they would like to continue to discuss residential hospice placement with their father as he is not fully grasping the concept of her moving there. Support given. Daughter and son recently arrived from [facility] today around noon and have not been able to discuss with him face to face and would like to do that amongst themselves. Family is aware they may contact primary provider or myself once they have made a decision regarding hospice home.   We discussed that patient may also have a hospital death. I discussed with them the process of transferring to residential hospice and they are aware that the local facility currently does not have any bed availability. If they chose to  transfer patient to [facility] facility the earliest availability would potentially be Thursday given patient did not pass away prior. Daughter verbalized understanding and appreciation.   Family continued to express their appreciation for the care patient was receiving from all medical staff.   Length of Stay: 5  Current Medications: Scheduled Meds:  . sodium chloride flush  3 mL Intravenous Q12H    Continuous Infusions: . sodium chloride      PRN Meds: sodium chloride, acetaminophen **OR** acetaminophen, glycopyrrolate **OR** glycopyrrolate **OR** glycopyrrolate, LORazepam **OR** LORazepam **OR** LORazepam, morphine injection, morphine CONCENTRATE **OR** morphine CONCENTRATE, ondansetron **OR** ondansetron (ZOFRAN) IV, sodium chloride flush  Physical Exam Vitals signs and nursing  note reviewed.  Constitutional:      Appearance: She is cachectic.  Cardiovascular:     Rate and Rhythm: Tachycardia present. Rhythm irregular.     Pulses: Decreased pulses.     Heart sounds: Murmur present.  Pulmonary:     Breath sounds: Decreased breath sounds present.     Comments: 5L/[place]  Skin:    General: Skin is warm and dry.     Findings: Bruising present.  Neurological:     Mental Status: She is lethargic.     Comments: Dementia, non-verbal, will spontaneously open eyes             Vital Signs: BP (!) 117/96 (BP Location: Right Arm)   Pulse (!) 104   Temp 98.5 F (36.9 C) (Oral)   Resp (!) 24   Ht 5\' 3"  (1.6 m)   Wt 36.7 kg   SpO2 95%   BMI 14.33 kg/m  SpO2: SpO2: 95 % O2 Device: O2 Device: Room Air O2 Flow Rate: O2 Flow Rate (L/min): 5 L/min  Intake/output summary: No intake or output data in the 24 hours ending 02/17/18 1445 LBM: Last BM Date: (pt and family unsure) Baseline Weight: Weight: 36.3 kg Most recent weight: Weight: 36.7 kg       Palliative Assessment/Data: PPS 10%    Flowsheet Rows     Most Recent Value  Intake Tab  Referral Department  Hospitalist  Unit  at Time of Referral  Med/Surg Unit  Date Notified  02/15/18  Palliative Care Type  Return patient Palliative Care  Reason for referral  Clarify Goals of Care  Date of Admission  02/12/18  # of days IP prior to Palliative referral  3  Clinical Assessment  Psychosocial & Spiritual Assessment  Palliative Care Outcomes      Patient Active Problem List   Diagnosis Date Noted  . Pneumonia 02/12/2018  . Protein-calorie malnutrition, severe 10/04/2017  . Syncope and collapse 10/02/2017  . Parkinson's disease ([place]) 06/10/2012    Palliative Care Assessment & Plan   Patient Profile: 72 y.o. female admitted on  02/12/2018 from home with husband with complaints of fever and cough. She has a past medical history of dementia, arthritis, and Parkinson's disease. During her ED course family reported patient has had decreased oral intake. Patient was responding with 1-2 words during her assessment. She has hospice services at home for support. NA 149, K 3.0, BUN 35, Cr 1.29, Albumin 3.0, WBC 20.6. Chest x-ray showed bilateral infiltrates, worrisome for pneumonia. She was started on ceftriaxone and azithromycin post cultures. Since admission patient is showing no signs of improvement. She is worsening and having increased signs of aspiration. Oxygen requirement increased to 4L. Flu and blood cultures have been negative. Palliative Medicine team consulted for goals of care.   Recommendations/Plan:  Continue with Full Comfort Measures  Anticipated hospital death versus hospice home transfer (no beds available earliest transfer would be Thursday if family decides on [facility] [facility] hospice home)  Family still in conversation regarding hospice home. Daughter/Son requesting to discuss amongst themselves with their father. Daughter will notify myself, bedside RN or attending provider once decision has been made.   Oxygen for comfort measures only. No escalation above 2L/[place].   PMT will continue to follow and  support. Writer will not be on service tomorrow 02/18/2018, if urgent needs may contact A. Jerline Pain, NP via [place]. I will follow up on Thursday if patient remains hospitalized.   Goals of Care and Additional Recommendations:  Limitations  on Scope of Treatment: Full Comfort Care  Code Status:    Code Status Orders  (From admission, onward)         Start     Ordered   02/16/18 1444  Do not attempt resuscitation (DNR)  Continuous    Question Answer Comment  In the event of cardiac or respiratory ARREST Do not call a "code blue"   In the event of cardiac or respiratory ARREST Do not perform Intubation, CPR, defibrillation or ACLS   In the event of cardiac or respiratory ARREST Use medication by any route, position, wound care, and other measures to relive pain and suffering. May use oxygen, suction and manual treatment of airway obstruction as needed for comfort.      02/16/18 1444        Code Status History    Date Active Date Inactive Code Status Order ID Comments User Context   02/12/2018 1607 02/16/2018 1444 DNR [id number]  Hillary Bow, MD ED   02/12/2018 1605 02/12/2018 1607 Full Code [id number]  Hillary Bow, MD ED   10/04/2017 1755 10/08/2017 1658 DNR [id number]  Harrie Foreman, MD Inpatient   10/02/2017 1659 10/04/2017 1755 Full Code [id number]  Saundra Shelling, MD Inpatient    Advance Directive Documentation     Most Recent Value  Type of Advance Directive  Healthcare Power of Attorney  Pre-existing out of facility DNR order (yellow form or pink MOST form)  -  "MOST" Form in Place?  -     Prognosis:   Hours - Days  Discharge Planning:  Anticipated Hospital Death VS Residential Hospice   Care plan was discussed with patient's family and Dr. Darvin Neighbours.   Thank you for allowing the Palliative Medicine Team to assist in the care of this patient.   Total Time 50 min.  Prolonged Time Billed  NO       Greater than 50%  of this time was spent counseling and coordinating  care related to the above assessment and plan.  Alda Lea, AGPCNP-BC Palliative Medicine Team  Pager: [phone] Amion: Bjorn Pippin   Please contact Palliative Medicine Team phone at [phone] for questions and concerns.

## 2018-02-17 NOTE — Progress Notes (Signed)
   02/17/18 1500  Clinical Encounter Type  Visited With Patient and family together  Visit Type Initial;Spiritual support;Social support  Referral From [place] responded to page regarding family support.  Chaplain met patient's two sisters and patient's children Hospital doctor and [place]).  The children and one sister asked to speak to chaplain outside of the room.  Conversation surrounding to what degree patient's spouse (children's step-father) understands patient's condition and how children can communicate information to him, discussion of what he may or may not understand and/or retain based on his health issues.  Chaplain encouraged them to share specific facts and information and to check in with spouse about what he heard them say and to encourage him to ask questions along the way.  Chaplain also encouraged children to have spouse present for meetings with care team as well.  Exploration of support for patient's spouse and for patient's children.  Upon return to room, chaplain prayed with patient and family at their request.  Chaplain will follow up with family later and encouraged them to have chaplain paged as needed.

## 2018-02-17 NOTE — Progress Notes (Signed)
Nutrition Follow Up Note   DOCUMENTATION CODES:   Severe malnutrition in context of chronic illness  INTERVENTION:   Magic cup TID with meals, each supplement provides 290 kcal and 9 grams of protein  NUTRITION DIAGNOSIS:   Severe Malnutrition related to chronic illness(Parkinson's disease, dementia ) as evidenced by severe fat depletion, severe muscle depletion, 9 percent weight loss in 4 months.  GOAL:   Patient will meet greater than or equal to 90% of their needs  -not met   MONITOR:   PO intake, Supplement acceptance, Labs, Weight trends, Skin, I & O's  ASSESSMENT:   72 y.o. female with a known history of dementia, arthritis, Parkinson's disease admitted with PNA and sepsis    Chart reviewed; pt to likely transfer to comfort care today. Can offer supplements for patient comfort if needed.   Medications reviewed  Labs reviewed: none recent   Diet Order:   Diet Order            DIET DYS 3 Room service appropriate? No; Fluid consistency: Thin  Diet effective now             EDUCATION NEEDS:   No education needs have been identified at this time  Skin:  Skin Assessment: Reviewed RN Assessment(ecchymosis )  Last BM:  pta  Height:   Ht Readings from Last 1 Encounters:  02/12/18 _0  (1.6 m)    Weight:   Wt Readings from Last 1 Encounters:  02/16/18 36.7 kg    Ideal Body Weight:  52.3 kg  BMI:  Body mass index is 14.33 kg/m.  Estimated Nutritional Needs:   Kcal:  1100-1300kcal/day   Protein:  50-57g/day   Fluid:  800-928m/day   CKoleen DistanceMS, RD, LDN Pager #- [phone]#- [phone] Hours Pager: [phone]

## 2018-02-17 NOTE — Progress Notes (Signed)
   02/17/18 1850  Clinical Encounter Type  Visited With Patient and family together  Visit Type Follow-up  [place] followed up with patient and family, sister and family friend at bedside.  Patient's sister reported that patient's children have gone home to rest as they drove up from [place] earlier today.  Patient's spouse may or may not be spending tonight.  Sister reported that patient prefers to not be alone, asked if chaplain could check in as able.  Chaplain agreed to try but reminded family that she was the sole chaplain on duty.

## 2018-02-17 NOTE — Progress Notes (Signed)
[facility] [place] NAME: Jillian Vaughn    MR#:  [id number]  DATE OF BIRTH:  1945/12/05  SUBJECTIVE:   Non verbal  Daughter and son here from [place]:  Unobtainable as the patient has dementia.  DRUG ALLERGIES:  No Known Allergies  VITALS:  Blood pressure (!) 117/96, pulse (!) 104, temperature 98.5 F (36.9 C), temperature source Oral, resp. rate (!) 24, height 5\' 3"  (1.6 m), weight 36.7 kg, SpO2 95 %.  PHYSICAL EXAMINATION:  GENERAL:  72 y.o.-year-old patient lying in the bed , looks critically ill.  LABORATORY PANEL:   CBC Recent Labs  Lab 02/15/18 0413  WBC 16.6*  HGB 11.1*  HCT 33.2*  PLT 470*   ------------------------------------------------------------------------------------------------------------------  Chemistries  Recent Labs  Lab 02/12/18 1456  02/15/18 0413  NA 149*   < > 139  K 3.0*   < > 3.9  CL 114*   < > 103  CO2 24   < > 26  GLUCOSE 155*   < > 123*  BUN 35*   < > 22  CREATININE 1.29*   < > 0.73  CALCIUM 9.1   < > 8.5*  AST 19  --   --   ALT 21  --   --   ALKPHOS 78  --   --   BILITOT 1.0  --   --    < > = values in this interval not displayed.   ------------------------------------------------------------------------------------------------------------------  Cardiac Enzymes Recent Labs  Lab 02/12/18 1456  TROPONINI <0.03   ------------------------------------------------------------------------------------------------------------------  RADIOLOGY:  No results found.  EKG:   Orders placed or performed during the hospital encounter of 02/12/18  . ED EKG  . ED EKG  . EKG 12-Lead  . EKG 12-Lead  . EKG 12-Lead  . EKG 12-Lead    ASSESSMENT AND PLAN:   *Sepsis secondary to bilateral community-acquired pneumonia with acute hypoxic respiratory failure *Hypernatremia secondary to dehydration with dementia.   *History of Parkinson's disease *Hypokalemia.    *Dementia.   * Severe protein calorie malnutrition  Transitioned to comfort care Continue PRN ativan and morphine  All the records are reviewed and case discussed with Care Management/Social Workerr. Management plans discussed with the patient, family and they are in agreement.  CODE STATUS: DNR  TOTAL  TIME TAKING CARE OF THIS PATIENT: 35 minutes.  >50%spent in discussing with family at bedside  Note: This dictation was prepared with Dragon dictation along with smaller phrase technology. Any transcriptional errors that result from this process are unintentional.  Neita Carp M.D on 02/17/2018 at 2:26 PM  Between 7am to 6pm - Pager - [phone]  After 6pm go to [email or web address] - password [email or web address] [facility]  Office  [phone]  CC: Primary care physician; Lenard Simmer, MD     spoke with Daughter Jillian Vaughn. Patient spoke with her  On the phone later after my evaluation. Will continue IV abx. Advised daughter to come in to visit from [place] as patient can decline quickly. Palliative care team consultation.

## 2018-02-18 NOTE — Progress Notes (Signed)
   02/18/18 1100  Clinical Encounter Type  Visited With Patient and family together (See note for family members.)  Visit Type Initial;Spiritual support;Other (Comment)  Referral From Physician  Recommendations Follow-up, if requested.  Spiritual Encounters  Spiritual Needs Grief support;Emotional;Prayer;Other (Comment) (Patient's EOL)  Stress Factors  Patient Stress Factors  (EOL)  Family Stress Factors Loss (Patient's EOL)   Chaplain was paged for EOL. Chaplain provided empathetic listening and presence for husband, Clair Gulling; daughter, [name]; son, Gerald Stabs; and sisters Bethena Roys and [place]. After remembrances and a conversation with Maurine Simmering offered a prayer for the patient and family, anointing of the patient, and a blessing for her EOL.

## 2018-02-18 NOTE — Progress Notes (Signed)
   02/18/18 0330  Clinical Encounter Type  Visited With Patient not available  Visit Type Follow-up;Spiritual support  Spiritual Encounters  Spiritual Needs Prayer   Chaplain attempted follow up based on earlier conversation with family.  Patient sleeping and another person was sleeping in the other bed in the room.  Chaplain offered silent and energetic prayers for patient and family.

## 2018-02-18 NOTE — Plan of Care (Signed)
Comfort care being provided. Turning and repositioning. Family at bedside. Oral care provided. Patient not voiding. Disoriented x4. Does not respond to verbal commands. Jillian Vaughn has seen the patient and family.   Problem: Education: Goal: Knowledge of General Education information will improve Description Including pain rating scale, medication(s)/side effects and non-pharmacologic comfort measures Outcome: Progressing   Problem: Health Behavior/Discharge Planning: Goal: Ability to manage health-related needs will improve Outcome: Progressing   Problem: Clinical Measurements: Goal: Ability to maintain clinical measurements within normal limits will improve Outcome: Progressing Goal: Will remain free from infection Outcome: Progressing Goal: Diagnostic test results will improve Outcome: Progressing Goal: Respiratory complications will improve Outcome: Progressing Goal: Cardiovascular complication will be avoided Outcome: Progressing   Problem: Activity: Goal: Risk for activity intolerance will decrease Outcome: Progressing   Problem: Nutrition: Goal: Adequate nutrition will be maintained Outcome: Progressing   Problem: Coping: Goal: Level of anxiety will decrease Outcome: Progressing   Problem: Elimination: Goal: Will not experience complications related to bowel motility Outcome: Progressing Goal: Will not experience complications related to urinary retention Outcome: Progressing   Problem: Pain Managment: Goal: General experience of comfort will improve Outcome: Progressing   Problem: Safety: Goal: Ability to remain free from injury will improve Outcome: Progressing   Problem: Skin Integrity: Goal: Risk for impaired skin integrity will decrease Outcome: Progressing   Problem: Activity: Goal: Ability to tolerate increased activity will improve Outcome: Progressing   Problem: Clinical Measurements: Goal: Ability to maintain a body temperature in the normal  range will improve Outcome: Progressing   Problem: Respiratory: Goal: Ability to maintain adequate ventilation will improve Outcome: Progressing Goal: Ability to maintain a clear airway will improve Outcome: Progressing

## 2018-02-18 NOTE — Clinical Social Work Note (Signed)
CSW informed by physician after he spoke with family that a hospital death is anticipated at this time.  Shela Leff MSW,LCSW [phone]

## 2018-02-18 NOTE — Progress Notes (Signed)
[facility] [place] NAME: Jillian Vaughn    MR#:  [id number]  DATE OF BIRTH:  [birth year]  SUBJECTIVE:   Lethargic with shallow breathing Family at bedside  REVIEW OF SYSTEMS:  Unobtainable  Patient is lethargic  DRUG ALLERGIES:  No Known Allergies  VITALS:  Blood pressure 135/73, pulse (!) 108, temperature 97.7 F (36.5 C), temperature source Axillary, resp. rate 12, height 5\' 3"  (1.6 m), weight 36.7 kg, SpO2 100 %.  PHYSICAL EXAMINATION:  GENERAL:  73 y.o.-year-old patient lying in the bed , looks critically ill. Shallow breathing  LABORATORY PANEL:   CBC Recent Labs  Lab 02/15/18 0413  WBC 16.6*  HGB 11.1*  HCT 33.2*  PLT 470*   ------------------------------------------------------------------------------------------------------------------  Chemistries  Recent Labs  Lab 02/12/18 1456  02/15/18 0413  NA 149*   < > 139  K 3.0*   < > 3.9  CL 114*   < > 103  CO2 24   < > 26  GLUCOSE 155*   < > 123*  BUN 35*   < > 22  CREATININE 1.29*   < > 0.73  CALCIUM 9.1   < > 8.5*  AST 19  --   --   ALT 21  --   --   ALKPHOS 78  --   --   BILITOT 1.0  --   --    < > = values in this interval not displayed.   ------------------------------------------------------------------------------------------------------------------  Cardiac Enzymes Recent Labs  Lab 02/12/18 1456  TROPONINI <0.03   ------------------------------------------------------------------------------------------------------------------  RADIOLOGY:  No results found.  EKG:   Orders placed or performed during the hospital encounter of 02/12/18  . ED EKG  . ED EKG  . EKG 12-Lead  . EKG 12-Lead  . EKG 12-Lead  . EKG 12-Lead    ASSESSMENT AND PLAN:   *Sepsis secondary to bilateral community-acquired pneumonia with acute hypoxic respiratory failure *Hypernatremia secondary to dehydration with dementia.   *History of Parkinson's  disease *Hypokalemia.   *Dementia.   * Severe protein calorie malnutrition  Transitioned to comfort care Continue PRN ativan and morphine Will continue inpatient care as patient seems nearing end-of-life and likely will pass later today  Discussed with family at bedside.  Also discussed with husband outside the room per his request.  All the records are reviewed and case discussed with Care Management/Social Workerr. Management plans discussed with the patient, family and they are in agreement.  CODE STATUS: DNR  TOTAL  TIME TAKING CARE OF THIS PATIENT: 25 minutes.  >50%spent in discussing with family at bedside  Note: This dictation was prepared with Dragon dictation along with smaller phrase technology. Any transcriptional errors that result from this process are unintentional.  Neita Carp M.D on 02/18/2018 at 1:25 PM  Between 7am to 6pm - Pager - [phone]  After 6pm go to [email or web address] - password [email or web address] [facility]  Office  [phone]  CC: Primary care physician; Lenard Simmer, MD     spoke with Daughter Fredderick Severance. Patient spoke with her  On the phone later after my evaluation. Will continue IV abx. Advised daughter to come in to visit from [place] as patient can decline quickly. Palliative care team consultation.

## 2018-02-19 DIAGNOSIS — N179 Acute kidney failure, unspecified: Secondary | ICD-10-CM

## 2018-02-19 MED ORDER — ACETAMINOPHEN 650 MG RE SUPP: 650.0000 mg | Freq: Four times a day (QID) | RECTAL | 0 refills | Status: AC | PRN

## 2018-02-19 MED ORDER — GLYCOPYRROLATE 1 MG PO TABS: 1.0000 mg | ORAL_TABLET | ORAL | Status: AC | PRN

## 2018-02-19 MED ORDER — ACETAMINOPHEN 325 MG PO TABS: 650.0000 mg | ORAL_TABLET | Freq: Four times a day (QID) | ORAL | Status: AC | PRN

## 2018-02-19 MED ORDER — MORPHINE SULFATE (PF) 2 MG/ML IV SOLN: 2.0000 mg | INTRAVENOUS | 0 refills | Status: AC | PRN

## 2018-02-19 MED ORDER — MORPHINE SULFATE (CONCENTRATE) 10 MG/0.5ML PO SOLN: 5.0000 mg | ORAL | Status: AC | PRN

## 2018-02-19 MED ORDER — GLYCOPYRROLATE 0.2 MG/ML IJ SOLN: 0.2000 mg | INTRAMUSCULAR | Status: AC | PRN

## 2018-02-19 MED ORDER — LORAZEPAM 2 MG/ML IJ SOLN: 1.0000 mg | INTRAMUSCULAR | 0 refills | Status: AC | PRN

## 2018-02-19 MED ORDER — ONDANSETRON 4 MG PO TBDP: 4.0000 mg | ORAL_TABLET | Freq: Four times a day (QID) | ORAL | 0 refills | Status: AC | PRN

## 2018-02-19 MED ORDER — LORAZEPAM 2 MG/ML PO CONC: 1.0000 mg | ORAL | 0 refills | Status: AC | PRN

## 2018-02-19 NOTE — Progress Notes (Signed)
New hospice home referral received from [facility] following a Palliative Medicine follow up visit. Patient is a 73 year old woman with a past medical history of dementia, arthritis, and Parkinson's disease. She was admitted from home on 12/26 for evaluation of poor oral intake, fever and cough. In the ED she was found to have elevated WBC at 20, chest xray showed bilateral infiltrates concerning for pneumonia. She was started on IV antibiotics, she has continued , despite medical interventions to decline. Palliative Medicine was consulted for goals of care and have met with family over the course of her stay. Family has at this time chosen to continued comfort focused care with transfer to the hospice home. Writer met int he room with patient's son Gerald Stabs and daughter [name] [facility]) and her sister Inez Catalina to initiated education regarding hospice services, philosophy and team approach care with understanding voiced. Questions answered, consents signed. Patient remained with her eyes open and did focus on writer, but was not able to communicate. She is not eating and remains on 3 liters of oxygen. Patient information faxed to referral, report called to the Hospice home. EMS notified for 5 pm transport. Hospital care team all updated. Patient to discharge with signed DNR in place.Thank you for the opportunity to be involved in the care of this patient and her family. Flo Shanks RN, BSN, [facility] [place], hospital liaison [phone]

## 2018-02-19 NOTE — Progress Notes (Signed)
Pt prepared for d/c to hospice home. IV's left in due to request from hospice facility. Skin intact except as charted in most recent assessments. Vitals are stable. Pt to be transported by ambulance service.  Saya Mccoll [employer]

## 2018-02-19 NOTE — Discharge Summary (Signed)
[facility] at [place] NAME: Jillian Vaughn    MR#:  [id number]  DATE OF BIRTH:  Oct 02, 1945  DATE OF ADMISSION:  02/12/2018 ADMITTING PHYSICIAN: Jillian Bow, MD  DATE OF DISCHARGE:  02/19/18 PRIMARY CARE PHYSICIAN: Jillian Simmer, MD    ADMISSION DIAGNOSIS:  AKI (acute kidney injury) ([place]) [N17.9] Pneumonia of both lungs due to infectious organism, unspecified part of lung [J18.9] Sepsis with acute hypoxic respiratory failure, due to unspecified organism, unspecified whether septic shock present ([place]) [A41.9, R65.20, J96.01]  DISCHARGE DIAGNOSIS:  Active Problems:   Pneumonia   SECONDARY DIAGNOSIS:   Past Medical History:  Diagnosis Date  . Arthritis   . Dementia ([place])   . Parkinson disease [facility])     HOSPITAL COURSE:  hpi  Jillian Vaughn  is a 73 y.o. female with a known history of dementia, arthritis, Parkinson's disease presents from home due to cough and not feeling well.  Patient has had decreased oral intake.  Here patient is talking only in 1-2 words and is poor historian with her dementia.  Family not available at bedside at this time. Patient was admitted last in August when she was discharged home with hospice services due to poor oral intake and dementia. Today patient has been found to have sepsis, bilateral pneumonia and acute hypoxic respiratory failure.  Hospital course *Sepsis secondary to bilateral community-acquired pneumonia with acute hypoxic respiratory failure *Hypernatremia secondary to dehydration with dementia.  *History of Parkinson's disease *Hypokalemia.  *Dementia.  * Severe protein calorie malnutrition  Transitioned to comfort care transfer patient to hospice home today Continue PRN ativan and morphine  Discussed with family and  husband at bedside, agreeable to transfer the patient to hospice home.  Bed is available and will transfer DISCHARGE CONDITIONS:   fair  CONSULTS OBTAINED:      PROCEDURES   DRUG ALLERGIES:  No Known Allergies  DISCHARGE MEDICATIONS:   Allergies as of 02/19/2018   No Known Allergies     Medication List    STOP taking these medications   acetaminophen 650 MG CR tablet Commonly known as:  TYLENOL Replaced by:  acetaminophen 325 MG tablet   amantadine 100 MG capsule Commonly known as:  SYMMETREL   aspirin 325 MG tablet   feeding supplement (ENSURE ENLIVE) Liqd   lubiprostone 24 MCG capsule Commonly known as:  AMITIZA   meloxicam 7.5 MG tablet Commonly known as:  MOBIC   midodrine 10 MG tablet Commonly known as:  PROAMATINE   mirtazapine 7.5 MG tablet Commonly known as:  REMERON   pravastatin 40 MG tablet Commonly known as:  PRAVACHOL   RECLAST 5 MG/100ML Soln injection Generic drug:  zoledronic acid   REQUIP 0.5 MG tablet Generic drug:  rOPINIRole   SINEMET 25-100 MG tablet Generic drug:  carbidopa-levodopa   Vitamin B-2 50 MG Tabs   Vitamin D (Ergocalciferol) 1.25 MG (50000 UT) Caps capsule Commonly known as:  DRISDOL     TAKE these medications   acetaminophen 325 MG tablet Commonly known as:  TYLENOL Take 2 tablets (650 mg total) by mouth every 6 (six) hours as needed for mild pain (or Fever >/= 101). Replaces:  acetaminophen 650 MG CR tablet   acetaminophen 650 MG suppository Commonly known as:  TYLENOL Place 1 suppository (650 mg total) rectally every 6 (six) hours as needed for mild pain (or Fever >/= 101).   glycopyrrolate 1 MG tablet Commonly known as:  ROBINUL Take 1  tablet (1 mg total) by mouth every 4 (four) hours as needed (excessive secretions).   glycopyrrolate 0.2 MG/ML injection Commonly known as:  ROBINUL Inject 1 mL (0.2 mg total) into the skin every 4 (four) hours as needed (excessive secretions).   LORazepam 2 MG/ML concentrated solution Commonly known as:  ATIVAN Place 0.5 mLs (1 mg total) under the tongue every 4 (four) hours as needed for anxiety.   LORazepam 2 MG/ML  injection Commonly known as:  ATIVAN Inject 0.5 mLs (1 mg total) into the vein every 4 (four) hours as needed for anxiety.   morphine 2 MG/ML injection Inject 1 mL (2 mg total) into the vein every 2 (two) hours as needed (or dyspnea).   morphine CONCENTRATE 10 MG/0.5ML Soln concentrated solution Take 0.25 mLs (5 mg total) by mouth every 2 (two) hours as needed for moderate pain (or dyspnea).   ondansetron 4 MG disintegrating tablet Commonly known as:  ZOFRAN-ODT Take 1 tablet (4 mg total) by mouth every 6 (six) hours as needed for nausea.        DISCHARGE INSTRUCTIONS:   Transfer to hospice home  DIET:   diet as tolerated  DISCHARGE CONDITION:  Fair  ACTIVITY:  Bedrest  OXYGEN:  Home Oxygen: Yes.     Oxygen Delivery: 2 liters/min via Patient connected to nasal cannula oxygen  DISCHARGE LOCATION:  Hospice home  If you experience worsening of your admission symptoms, develop shortness of breath, life threatening emergency, suicidal or homicidal thoughts you must seek medical attention immediately by calling 911 or calling your MD immediately  if symptoms less severe.  You Must read complete instructions/literature along with all the possible adverse reactions/side effects for all the Medicines you take and that have been prescribed to you. Take any new Medicines after you have completely understood and accpet all the possible adverse reactions/side effects.   Please note  You were cared for by a hospitalist during your hospital stay. If you have any questions about your discharge medications or the care you received while you were in the hospital after you are discharged, you can call the unit and asked to speak with the hospitalist on call if the hospitalist that took care of you is not available. Once you are discharged, your primary care physician will handle any further medical issues. Please note that NO REFILLS for any discharge medications will be authorized once you  are discharged, as it is imperative that you return to your primary care physician (or establish a relationship with a primary care physician if you do not have one) for your aftercare needs so that they can reassess your need for medications and monitor your lab values.     Today  Chief Complaint  Patient presents with  . Cough  . Fever   Patient was more alert in the morning and took some applesauce according to the husband, son and daughter at bedside.  Agreeable to transfer the patient to hospice home.  ROS: Unobtainable   VITAL SIGNS:  Blood pressure (!) 147/78, pulse 76, temperature 97.7 F (36.5 C), temperature source Axillary, resp. rate (!) 9, height 5\' 3"  (1.6 m), weight 36.7 kg, SpO2 100 %.  I/O:    Intake/Output Summary (Last 24 hours) at 02/19/2018 1315 Last data filed at 02/19/2018 0900 Gross per 24 hour  Intake 360 ml  Output -  Net 360 ml    PHYSICAL EXAMINATION:  GENERAL:  73 y.o.-year-old patient lying in the bed with no acute distress.  LUNGS: Diminished breath sounds bilaterally, no wheezing, rales,rhonchi or crepitation. No use of accessory muscles of respiration.  CARDIOVASCULAR: S1, S2 normal.  ABDOMEN: Soft, non-tender, non-distended. Bowel sounds present.  EXTREMITIES: No pedal edema, cyanosis, or clubbing.  NEUROLOGIC: Lethargic but arousable sensation intact. Gait not checked.  PSYCHIATRIC: The patient is lethargic but arousable   DATA REVIEW:   CBC Recent Labs  Lab 02/15/18 0413  WBC 16.6*  HGB 11.1*  HCT 33.2*  PLT 470*    Chemistries  Recent Labs  Lab 02/12/18 1456  02/15/18 0413  NA 149*   < > 139  K 3.0*   < > 3.9  CL 114*   < > 103  CO2 24   < > 26  GLUCOSE 155*   < > 123*  BUN 35*   < > 22  CREATININE 1.29*   < > 0.73  CALCIUM 9.1   < > 8.5*  AST 19  --   --   ALT 21  --   --   ALKPHOS 78  --   --   BILITOT 1.0  --   --    < > = values in this interval not displayed.    Cardiac Enzymes Recent Labs  Lab  02/12/18 1456  TROPONINI <0.03    Microbiology Results  Results for orders placed or performed during the hospital encounter of 02/12/18  Blood Culture (routine x 2)     Status: None   Collection Time: 02/12/18  3:33 PM  Result Value Ref Range Status   Specimen Description BLOOD BLOOD RIGHT FOREARM  Final   Special Requests   Final    BOTTLES DRAWN AEROBIC AND ANAEROBIC Blood Culture adequate volume   Culture   Final    NO GROWTH 5 DAYS Performed at [facility], [address], [place], [place] [address]    Report Status 02/17/2018 FINAL  Final  Blood Culture (routine x 2)     Status: None   Collection Time: 02/12/18  3:33 PM  Result Value Ref Range Status   Specimen Description BLOOD RIGHT ANTECUBITAL  Final   Special Requests   Final    BOTTLES DRAWN AEROBIC AND ANAEROBIC Blood Culture adequate volume   Culture   Final    NO GROWTH 5 DAYS Performed at [facility], [address], [place], [place] [address]    Report Status 02/17/2018 FINAL  Final  Urine culture     Status: None   Collection Time: 02/12/18  3:33 PM  Result Value Ref Range Status   Specimen Description   Final    URINE, RANDOM Performed at [facility], [address], [place], [place] [address]    Special Requests   Final    NONE Performed at [facility], [address] [place], [place] [address]    Culture   Final    NO GROWTH Performed at [facility], [place] [address], [place], [place] [address]    Report Status 02/14/2018 FINAL  Final    RADIOLOGY:  No results found.  EKG:   Orders placed or performed during the hospital encounter of 02/12/18  . ED EKG  . ED EKG  . EKG 12-Lead  . EKG 12-Lead  . EKG 12-Lead  . EKG 12-Lead      Management plans discussed with the patient's  family and they are in agreement.  CODE STATUS:     Code Status Orders  (From admission, onward)  Start     Ordered   02/16/18 1444  Do not attempt  resuscitation (DNR)  Continuous    Question Answer Comment  In the event of cardiac or respiratory ARREST Do not call a "code blue"   In the event of cardiac or respiratory ARREST Do not perform Intubation, CPR, defibrillation or ACLS   In the event of cardiac or respiratory ARREST Use medication by any route, position, wound care, and other measures to relive pain and suffering. May use oxygen, suction and manual treatment of airway obstruction as needed for comfort.      02/16/18 1444        Code Status History    Date Active Date Inactive Code Status Order ID Comments User Context   02/12/2018 1607 02/16/2018 1444 DNR [id number]  Jillian Bow, MD ED   02/12/2018 1605 02/12/2018 1607 Full Code [id number]  Jillian Bow, MD ED   10/04/2017 1755 10/08/2017 1658 DNR [id number]  Harrie Foreman, MD Inpatient   10/02/2017 1659 10/04/2017 1755 Full Code [id number]  Saundra Shelling, MD Inpatient    Advance Directive Documentation     Most Recent Value  Type of Advance Directive  Healthcare Power of Attorney  Pre-existing out of facility DNR order (yellow form or pink MOST form)  -  "MOST" Form in Place?  -      TOTAL TIME TAKING CARE OF THIS PATIENT: 44minutes.   Note: This dictation was prepared with Dragon dictation along with smaller phrase technology. Any transcriptional errors that result from this process are unintentional.   @[facility] @  on 02/19/2018 at 1:15 PM  Between 7am to 6pm - Pager - [phone]  After 6pm go to [email or web address] - password [email or web address] [place]  Office  [phone]  CC: Primary care physician; Jillian Simmer, MD

## 2018-02-19 NOTE — Progress Notes (Signed)
Daily Progress Note   Patient Name: Jillian Vaughn       Date: 02/19/2018 DOB: 06-12-1945  Age: 73 y.o. MRN#: [id number] Attending Physician: Nicholes Mango, MD Primary Care Physician: Lenard Simmer, MD Admit Date: 02/12/2018  Reason for Consultation/Follow-up: Non pain symptom management, Pain control and Psychosocial/spiritual support  Subjective: Patient in bed resting comfortably. Remains somewhat lethargic at times, but is more awake with eyes open and seem to be tracking. Continues to mouth breath. No po intake due to high risk of aspiration, lethargy,  and dysphagia. Patient is full comfort EOL care. Receiving Ativan, Morphine, and Robinul PRN for symptoms.   Family is at bedside (daughter, son, husband, and sister). We discussed patient's current state. Daughter verbalizes she feels patient is more awake today. She expresses she knows patients can rally prior to death and is not altered knowing she is actively dying. Family agrees and expresses their thankfulness that she has opened eyes and seems to respond to their voice, and allowed to express their love and goodbyes to her with what seems to be more alertness. Support given. Daughter expressed their wishes to reconsider residential hospice given patient has not passed away and appears to be able to transport. Agreed patient is more suitable for transfer compared to previous days. I educated family, although she appears more awake (with eyes open) there still remains a chance she could pass away at anytime considering her severe malnutrition, no po/iv fluids. Family verbalized understanding.   I explained referral process for residential hospice consideration. Family all mutually agreed their wishes are for her to be considered for  transport. Support given. All questions answered.   Length of Stay: 7  Current Medications: Scheduled Meds:    Continuous Infusions:  PRN Meds: acetaminophen **OR** acetaminophen, glycopyrrolate **OR** glycopyrrolate **OR** glycopyrrolate, LORazepam **OR** LORazepam **OR** LORazepam, morphine injection, morphine CONCENTRATE **OR** morphine CONCENTRATE, ondansetron **OR** ondansetron (ZOFRAN) IV  Physical Exam Vitals signs and nursing note reviewed.  Constitutional:      General: She is awake.     Appearance: She is cachectic. She is ill-appearing.     Comments: Actively dying  Cardiovascular:     Rate and Rhythm: Rhythm irregular.     Pulses: Decreased pulses.     Heart sounds: Murmur present.  Pulmonary:     Breath  sounds: Decreased breath sounds present.     Comments: Shallow respirations, 2L/[place], mouth breathing  Abdominal:     General: Bowel sounds are decreased.  Musculoskeletal:     Comments: Bedbound, contracted legs  Skin:    General: Skin is warm and dry.     Findings: Bruising present.     Comments: Thin, cachetic   Neurological:     Comments: Awake, nonverbal, moving lips to mimic words             Vital Signs: BP (!) 147/78   Pulse 76   Temp 97.7 F (36.5 C) (Axillary)   Resp (!) 9   Ht 5\' 3"  (1.6 m)   Wt 36.7 kg   SpO2 100%   BMI 14.33 kg/m  SpO2: SpO2: 100 % O2 Device: O2 Device: Nasal Cannula O2 Flow Rate: O2 Flow Rate (L/min): 3 L/min  Intake/output summary:   Intake/Output Summary (Last 24 hours) at 02/19/2018 1048 Last data filed at 02/19/2018 0900 Gross per 24 hour  Intake 360 ml  Output -  Net 360 ml   LBM: Last BM Date: (pt and family unsure) Baseline Weight: Weight: 36.3 kg Most recent weight: Weight: 36.7 kg       Palliative Assessment/Data: PPS 10%, NO PO INTAKE, FULL COMFORT    Flowsheet Rows     Most Recent Value  Intake Tab  Referral Department  Hospitalist  Unit at Time of Referral  Med/Surg Unit  Date Notified   02/15/18  Palliative Care Type  Return patient Palliative Care  Reason for referral  Clarify Goals of Care  Date of Admission  02/12/18  # of days IP prior to Palliative referral  3  Clinical Assessment  Psychosocial & Spiritual Assessment  Palliative Care Outcomes      Patient Active Problem List   Diagnosis Date Noted  . Pneumonia 02/12/2018  . Protein-calorie malnutrition, severe 10/04/2017  . Syncope and collapse 10/02/2017  . Parkinson's disease ([place]) 06/10/2012    Palliative Care Assessment & Plan   Patient Profile: 73 y.o. female admitted on  02/12/2018 from home with husband with complaints of fever and cough. She has a past medical history of dementia, arthritis, and Parkinson's disease. During her ED course family reported patient has had decreased oral intake. Patient was responding with 1-2 words during her assessment. She has hospice services at home for support. NA 149, K 3.0, BUN 35, Cr 1.29, Albumin 3.0, WBC 20.6. Chest x-ray showed bilateral infiltrates, worrisome for pneumonia. She was started on ceftriaxone and azithromycin post cultures. Since admission patient is showing no signs of improvement. She is worsening and having increased signs of aspiration. Oxygen requirement increased to 4L. Flu and blood cultures have been negative. Palliative Medicine team consulted for goals of care.   Recommendations/Plan:  DNR/DNI  Full Comfort Care  Family request residential hospice placement given patient remains hospitalized, opens eyes today.   CSW referral for residential hospice.   Continue with all comfort measures for symptom management  PMT will continue to follow and support.   Goals of Care and Additional Recommendations:  Limitations on Scope of Treatment: Full Comfort Care  Code Status:    Code Status Orders  (From admission, onward)         Start     Ordered   02/16/18 1444  Do not attempt resuscitation (DNR)  Continuous    Question Answer  Comment  In the event of cardiac or respiratory ARREST Do not call a "code  blue"   In the event of cardiac or respiratory ARREST Do not perform Intubation, CPR, defibrillation or ACLS   In the event of cardiac or respiratory ARREST Use medication by any route, position, wound care, and other measures to relive pain and suffering. May use oxygen, suction and manual treatment of airway obstruction as needed for comfort.      02/16/18 1444        Code Status History    Date Active Date Inactive Code Status Order ID Comments User Context   02/12/2018 1607 02/16/2018 1444 DNR [id number]  Hillary Bow, MD ED   02/12/2018 1605 02/12/2018 1607 Full Code [id number]  Hillary Bow, MD ED   10/04/2017 1755 10/08/2017 1658 DNR [id number]  Harrie Foreman, MD Inpatient   10/02/2017 1659 10/04/2017 1755 Full Code [id number]  Saundra Shelling, MD Inpatient    Advance Directive Documentation     Most Recent Value  Type of Advance Directive  Healthcare Power of Attorney  Pre-existing out of facility DNR order (yellow form or pink MOST form)  -  "MOST" Form in Place?  -      Prognosis:   Hours - Days-FULL COMFORT CARE   Discharge Planning:  Hospice facility  Care plan was discussed with patient's family, bedside RN, Dr. Margaretmary Eddy, [place], [place].   Thank you for allowing the Palliative Medicine Team to assist in the care of this patient.   Total Time 45 min.  Prolonged Time Billed  NO       Greater than 50%  of this time was spent counseling and coordinating care related to the above assessment and plan.  Alda Lea, AGPCNP-BC Palliative Medicine Team  Pager: [phone] Amion: Bjorn Pippin ] Please contact Palliative Medicine Team phone at [phone] for questions and concerns.

## 2018-02-19 NOTE — Clinical Social Work Note (Signed)
Patient discharging to hospice home today. Per Palliative Care, patient's daughter requests [facility]. Referral given to Santiago Glad with hospice. Santiago Glad facilitated discharge with the physician, patient/family, and hospice home.  Shela Leff MSW,LCSW [phone]

## 2018-03-21 DEATH — deceased

## 2019-10-01 IMAGING — CT CT HEAD W/O CM
3 series · 15 of 45 positions shown, 18 images · non-contrast
Comparison: Prior head CT 05/16/2017

CLINICAL DATA: 71-year-old female with altered mental status. Known
history of Parkinson's disease and Parkinson's dementia

EXAM:
CT HEAD WITHOUT CONTRAST
TECHNIQUE: Contiguous axial images were obtained from the base of the skull
through the vertex without intravenous contrast.

[Series 2: head wo · axial · 0.47mm/px · z∈[-101,+14]mm · 9 of 28 slices shown, 12 images]
[im 3/28  brain]
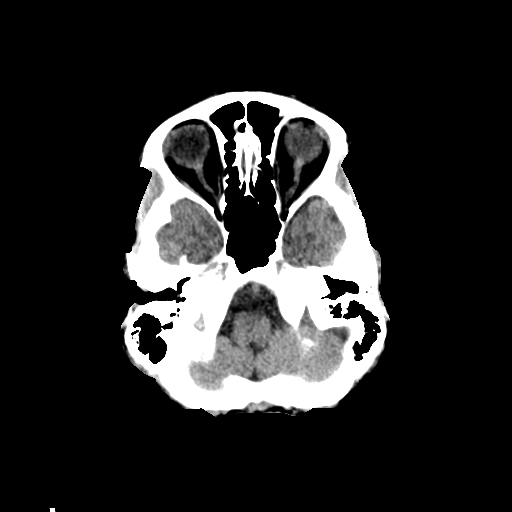
[im 3/28  bone]
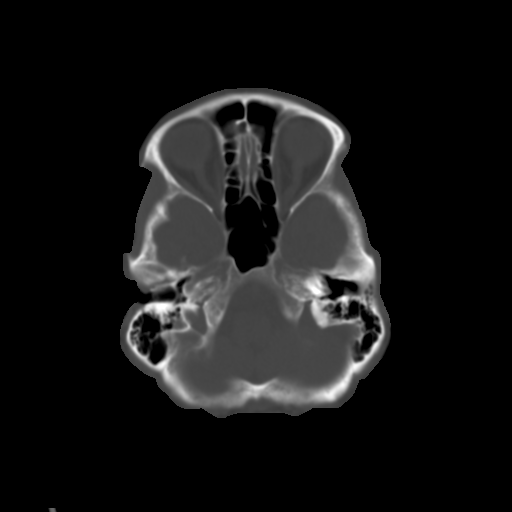
[im 6/28  brain]
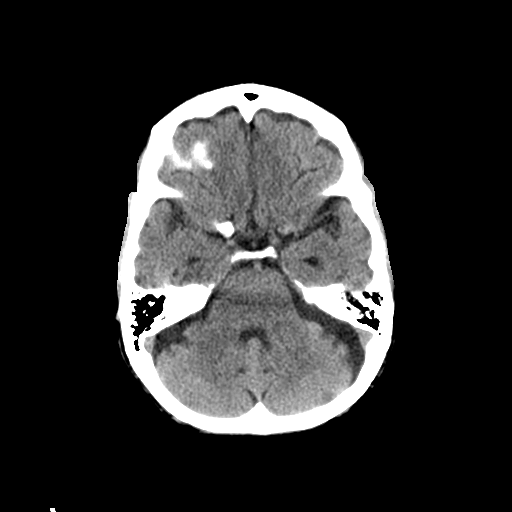
[im 9/28  brain]
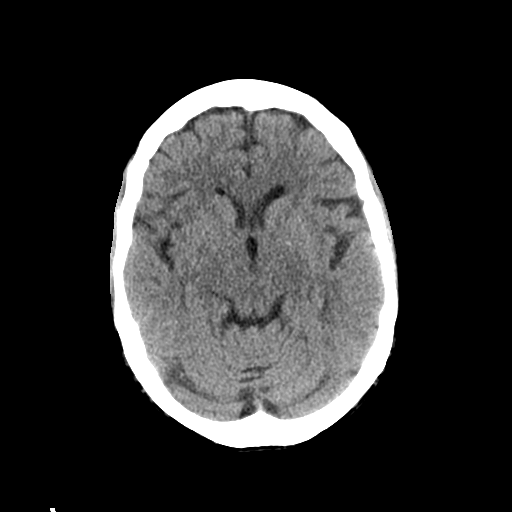
[im 12/28  brain]
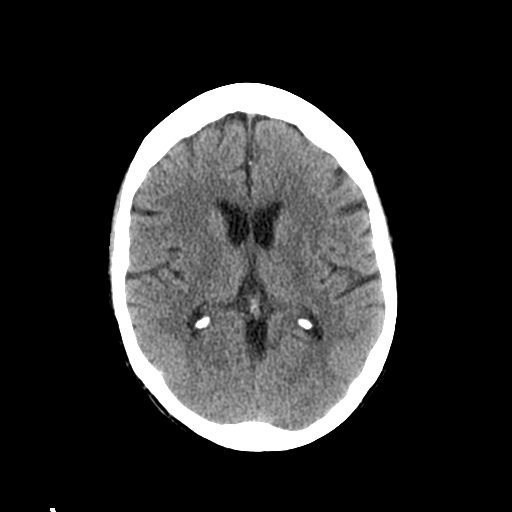
[im 15/28  brain]
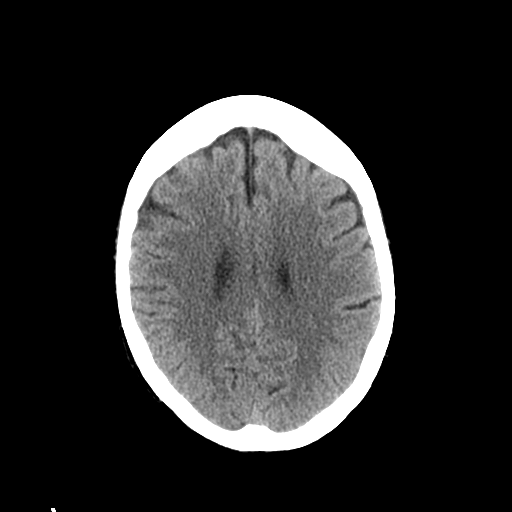
[im 15/28  bone]
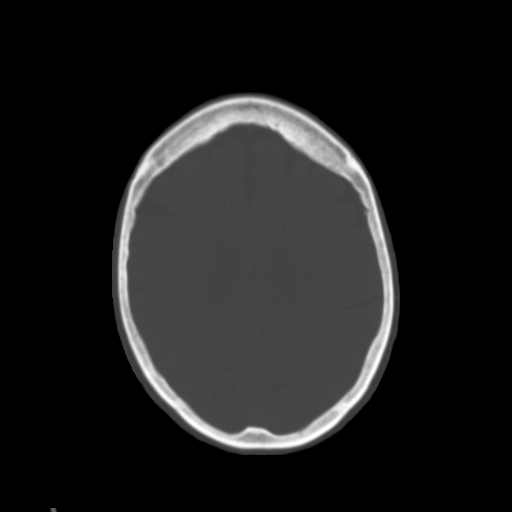
[im 17/28  brain]
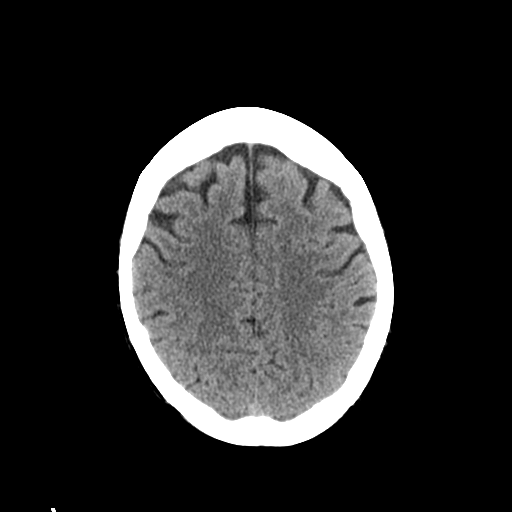
[im 20/28  brain]
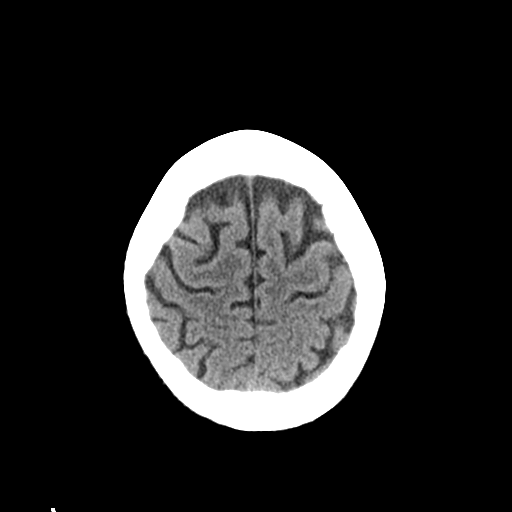
[im 23/28  brain]
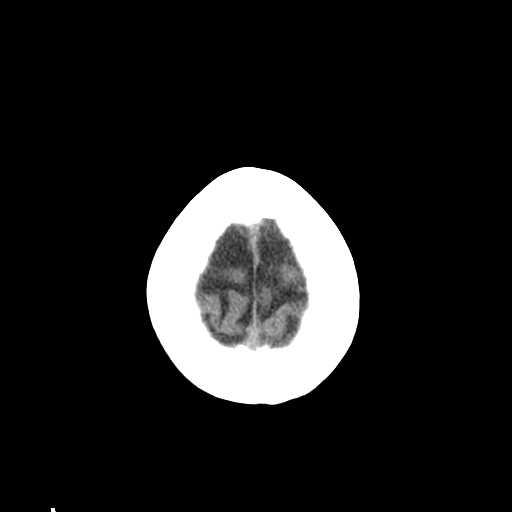
[im 26/28  brain]
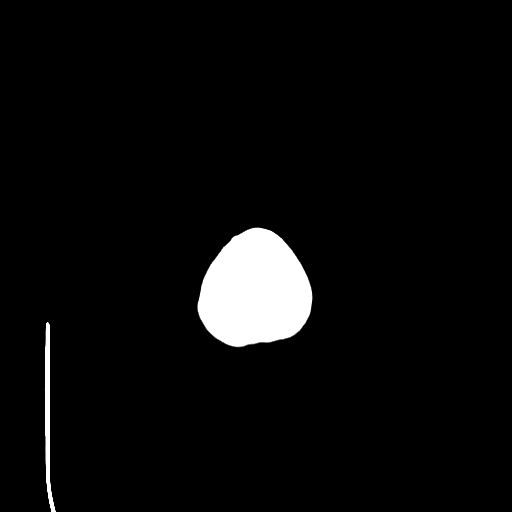
[im 26/28  bone]
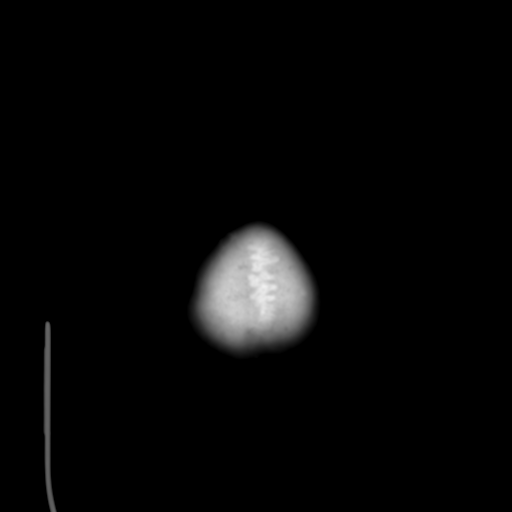

[Series 4: coronal soft tissue · coronal · 0.26mm/px · 3 of 63 slices shown]
[im 21/63  brain]
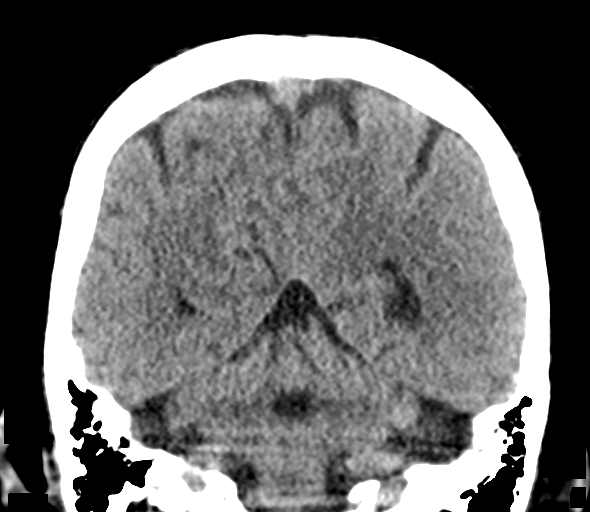
[im 28/63  brain]
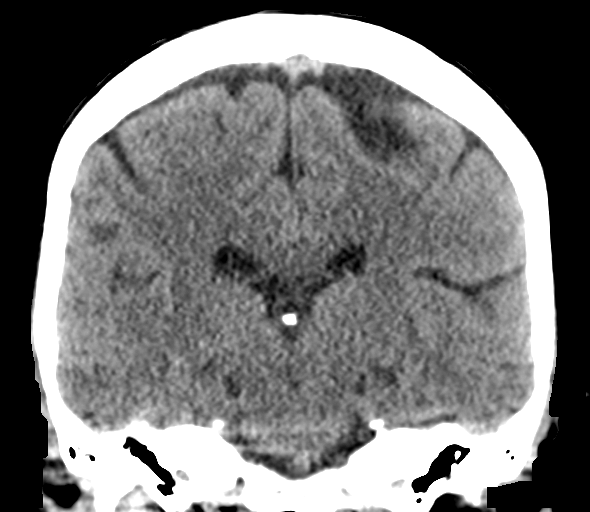
[im 35/63  brain]
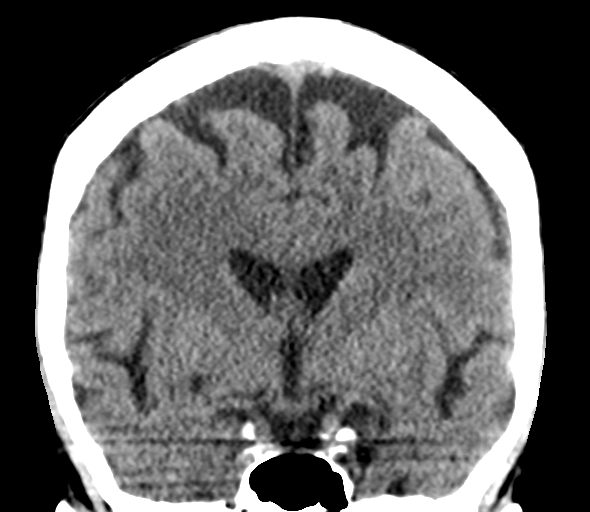

[Series 5: sagittal soft tissue · sagittal · 0.28mm/px · 3 of 49 slices shown]
[im 17/49  brain]
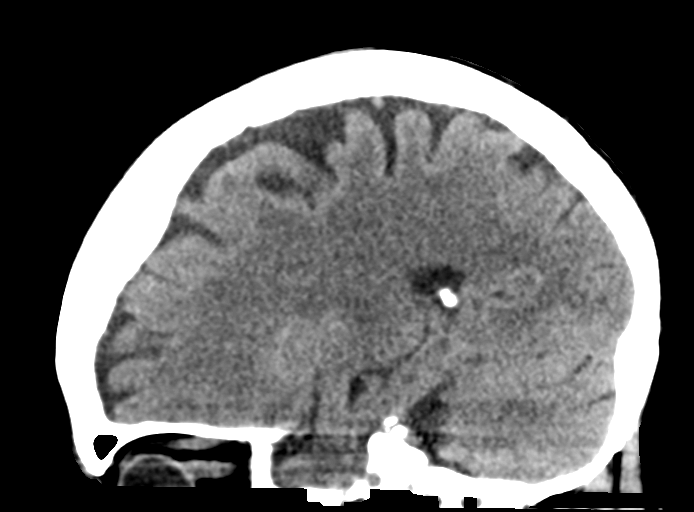
[im 25/49  brain]
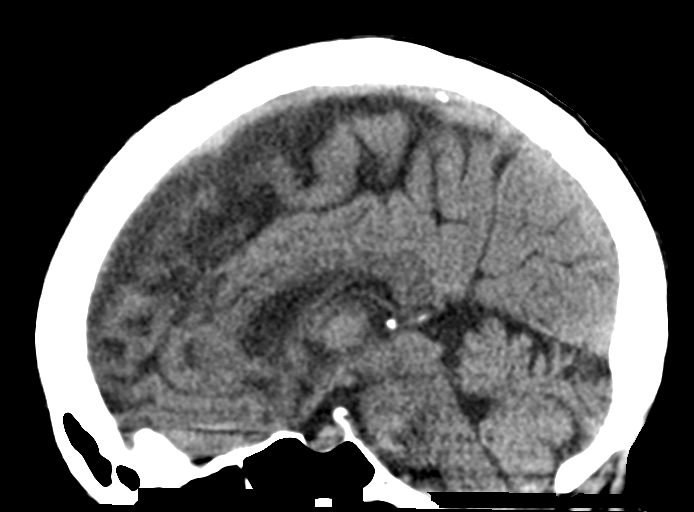
[im 33/49  brain]
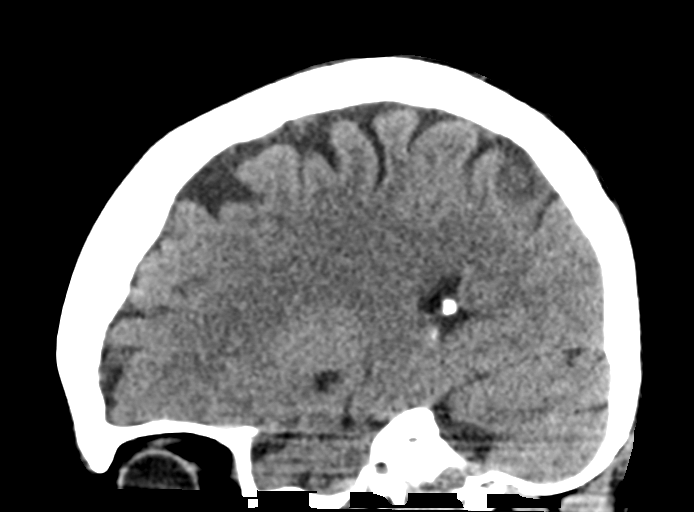

[15 of 45 positions shown; findings below may reference images not displayed]

FINDINGS: Brain: No evidence of acute infarction, hemorrhage, hydrocephalus,
extra-axial collection or mass lesion/mass effect.

Vascular: No hyperdense vessel or unexpected calcification.

Skull: Normal. Negative for fracture or focal lesion.

Sinuses/Orbits: No acute finding.

Other: None.
IMPRESSION: Negative head CT.
